# Patient Record
Sex: Male | Born: 1960
Health system: Southern US, Community
[De-identification: ages and names within clinical notes are randomized; demographics above are authoritative.]

## PROBLEM LIST (undated history)

## (undated) DIAGNOSIS — I219 Acute myocardial infarction, unspecified: Secondary | ICD-10-CM

## (undated) DIAGNOSIS — E785 Hyperlipidemia, unspecified: Secondary | ICD-10-CM

## (undated) DIAGNOSIS — E790 Hyperuricemia without signs of inflammatory arthritis and tophaceous disease: Secondary | ICD-10-CM

## (undated) DIAGNOSIS — I519 Heart disease, unspecified: Secondary | ICD-10-CM

## (undated) DIAGNOSIS — I10 Essential (primary) hypertension: Secondary | ICD-10-CM

## (undated) HISTORY — DX: Heart disease, unspecified: I51.9

## (undated) HISTORY — DX: Essential (primary) hypertension: I10

## (undated) HISTORY — DX: Acute myocardial infarction, unspecified: I21.9

## (undated) HISTORY — DX: Hyperlipidemia, unspecified: E78.5

---
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
## 2008-01-05 HISTORY — PX: ELBOW SURGERY: SHX618

## 2010-05-05 HISTORY — PX: CORONARY STENT PLACEMENT: SHX1402

## 2010-07-14 DIAGNOSIS — Z720 Tobacco use: Secondary | ICD-10-CM | POA: Insufficient documentation

## 2011-04-30 LAB — BASIC METABOLIC PANEL
BUN: 19 mg/dL (ref 4–21)
Creatinine: 1 mg/dL (ref 0.6–1.3)
Glucose: 111 mg/dL
POTASSIUM: 4.6 mmol/L (ref 3.4–5.3)
SODIUM: 142 mmol/L (ref 137–147)

## 2011-04-30 LAB — LIPID PANEL
Cholesterol: 166 mg/dL (ref 0–200)
HDL: 44 mg/dL (ref 35–70)
LDL Cholesterol: 82 mg/dL
Triglycerides: 201 mg/dL — AB (ref 40–160)

## 2011-04-30 LAB — HEPATIC FUNCTION PANEL
ALT: 40 U/L (ref 10–40)
AST: 40 U/L (ref 14–40)

## 2013-04-13 ENCOUNTER — Ambulatory Visit (INDEPENDENT_AMBULATORY_CARE_PROVIDER_SITE_OTHER): Payer: Managed Care, Other (non HMO) | Admitting: Family Medicine

## 2013-04-13 ENCOUNTER — Encounter: Payer: Self-pay | Admitting: Family Medicine

## 2013-04-13 VITALS — BP 129/87 | HR 72 | Ht 69.0 in | Wt 209.0 lb

## 2013-04-13 DIAGNOSIS — M76899 Other specified enthesopathies of unspecified lower limb, excluding foot: Secondary | ICD-10-CM

## 2013-04-13 DIAGNOSIS — I252 Old myocardial infarction: Secondary | ICD-10-CM

## 2013-04-13 DIAGNOSIS — M25859 Other specified joint disorders, unspecified hip: Secondary | ICD-10-CM | POA: Insufficient documentation

## 2013-04-13 DIAGNOSIS — E785 Hyperlipidemia, unspecified: Secondary | ICD-10-CM | POA: Insufficient documentation

## 2013-04-13 DIAGNOSIS — I2510 Atherosclerotic heart disease of native coronary artery without angina pectoris: Secondary | ICD-10-CM | POA: Insufficient documentation

## 2013-04-13 DIAGNOSIS — I10 Essential (primary) hypertension: Secondary | ICD-10-CM | POA: Insufficient documentation

## 2013-04-13 MED ORDER — METOPROLOL SUCCINATE ER 50 MG PO TB24: 50.0000 mg | ORAL_TABLET | Freq: Every day | ORAL | Status: DC

## 2013-04-13 MED ORDER — LISINOPRIL-HYDROCHLOROTHIAZIDE 20-12.5 MG PO TABS: 1.0000 | ORAL_TABLET | Freq: Every day | ORAL | Status: DC

## 2013-04-13 MED ORDER — TEMAZEPAM 30 MG PO CAPS: 30.0000 mg | ORAL_CAPSULE | Freq: Every evening | ORAL | Status: DC | PRN

## 2013-04-13 MED ORDER — PRASUGREL HCL 10 MG PO TABS: 10.0000 mg | ORAL_TABLET | Freq: Every day | ORAL | Status: DC

## 2013-04-13 MED ORDER — PITAVASTATIN CALCIUM 4 MG PO TABS: 1.0000 | ORAL_TABLET | Freq: Every day | ORAL | Status: DC

## 2013-04-13 NOTE — Progress Notes (Signed)
CC: Vincent NatterKevin Podoll is a 53 y.o. male is here for Establish Care and need meds refilled and lab work   Subjective: HPI:  Pleasant 53 year old here to establish care  Reports a history of essential hypertension currently taking lisinopril-hydrochlorothiazide and metoprolol. No outside blood pressures to report. He believes his blood pressure has been well controlled for years on this combination. Denies motor or sensory disturbances, chest pain, shortness of breath, orthopnea, peripheral edema, cough, nor cramping.  History of coronary artery disease with a non-ST elevation MI in 2012. He's not seen his cardiologist in over a year. He is taking a baby aspirin and effient on a daily basis without bruising or bleeding issues. Denies exertional or rest chest pain. Denies limb claudication with activity or elevation.  History of hyperlipidemia with intolerance to Crestor and Lipitor which caused severe myalgias. Has been taking Livalo up until one month ago, he had to stop because his insurance company would no longer cover this. He believes his cholesterol was well controlled while taking Livalo.  He reports a history of chronic hip pain bilaterally which is present only at the end of the day please been active during the day. Is described as a soreness anterior and in the groin. Been present for years has not been changing in severity or character. X-ray report from 2011 suggest femoral acetabular impingement. He takes an occasional hydrocodone for pain control, he understandably tries to avoid nonsteroidal anti-inflammatories with his heart disease   Review of Systems - General ROS: negative for - chills, fever, night sweats, weight gain or weight loss Ophthalmic ROS: negative for - decreased vision Psychological ROS: negative for - anxiety or depression ENT ROS: negative for - hearing change, nasal congestion, tinnitus or allergies Hematological and Lymphatic ROS: negative for - bleeding problems,  bruising or swollen lymph nodes Breast ROS: negative Respiratory ROS: no cough, shortness of breath, or wheezing Cardiovascular ROS: no chest pain or dyspnea on exertion Gastrointestinal ROS: no abdominal pain, change in bowel habits, or black or bloody stools Genito-Urinary ROS: negative for - genital discharge, genital ulcers, incontinence or abnormal bleeding from genitals Musculoskeletal ROS: negative for - joint pain or muscle pain other than that described above Neurological ROS: negative for - headaches or memory loss Dermatological ROS: negative for lumps, mole changes, rash and skin lesion changes  Past Medical History  Diagnosis Date  . Heart disease   . Heart attack   . Hypertension   . Hyperlipidemia     Past Surgical History  Procedure Laterality Date  . Coronary stent placement  05/2010  . Elbow surgery  2010   Family History  Problem Relation Age of Onset  . Breast cancer    . Heart attack    . Diabetes    . Hyperlipidemia    . Hypertension      History   Social History  . Marital Status: Unknown    Spouse Name: N/A    Number of Children: N/A  . Years of Education: N/A   Occupational History  . Not on file.   Social History Main Topics  . Smoking status: Current Every Day Smoker -- 1.00 packs/day for 20 years    Types: Cigarettes  . Smokeless tobacco: Not on file  . Alcohol Use: 0.5 oz/week    1 drink(s) per week  . Drug Use: No  . Sexual Activity: Yes    Partners: Female   Other Topics Concern  . Not on file   Social History  Narrative  . No narrative on file     Objective: BP 129/87  Pulse 72  Ht 5\' 9"  (1.753 m)  Wt 209 lb (94.802 kg)  BMI 30.85 kg/m2  General: Alert and Oriented, No Acute Distress HEENT: Pupils equal, round, reactive to light. Conjunctivae clear.   or status membranes pharynx unremarkable  Lungs: Clear to auscultation bilaterally, no wheezing/ronchi/rales.  Comfortable work of breathing. Good air movement. Cardiac:  Regular rate and rhythm. Normal S1/S2.  No murmurs, rubs, nor gallops.   Abdomen:  obese soft nontender Extremities: No peripheral edema.  Strong peripheral pulses.  Mental Status: No depression, anxiety, nor agitation. Skin: Warm and dry.  Assessment & Plan: Darrelle was seen today for establish care and need meds refilled and lab work.  Diagnoses and associated orders for this visit:  Essential hypertension, benign - BASIC METABOLIC PANEL WITH GFR  CAD (coronary artery disease)  History of MI (myocardial infarction)  Hyperlipidemia - Lipid panel  Femoral acetabular impingement  Other Orders - lisinopril-hydrochlorothiazide (PRINZIDE) 20-12.5 MG per tablet; Take 1 tablet by mouth daily. - prasugrel (EFFIENT) 10 MG TABS tablet; Take 1 tablet (10 mg total) by mouth daily. - Pitavastatin Calcium (LIVALO) 4 MG TABS; Take 1 tablet (4 mg total) by mouth daily. - temazepam (RESTORIL) 30 MG capsule; Take 1 capsule (30 mg total) by mouth at bedtime as needed for sleep. - metoprolol succinate (TOPROL-XL) 50 MG 24 hr tablet; Take 1 tablet (50 mg total) by mouth daily. Take with or immediately following a meal.    essential hypertension: Controlled continue lisinopril hydrochlorothiazide metoprolol   coronary artery disease with history of MI: Stable, Continue aspirin and effient, he is due for annual followup with his cardiologist I encouraged him to followup. Encouraged to stop smoking. Hyperlipidemia: Uncontrolled since he has a history of MI and not currently on statin, I encouraged him to restart livalo, he was provided with 2 weeks worth of samples and a new prescription with a saving card.  Anticipate a prior authorization for this. FAI: Stable and controlled with occasional hydrocodone  He was given labs for fasting lipid and metabolic panel to be obtained one to 2 months after starting livalo.  Return in about 3 months (around 07/13/2013).

## 2013-05-29 ENCOUNTER — Encounter: Payer: Self-pay | Admitting: Family Medicine

## 2013-05-29 DIAGNOSIS — Z86010 Personal history of colonic polyps: Secondary | ICD-10-CM | POA: Insufficient documentation

## 2013-05-29 DIAGNOSIS — Z87442 Personal history of urinary calculi: Secondary | ICD-10-CM | POA: Insufficient documentation

## 2013-05-30 ENCOUNTER — Encounter: Payer: Self-pay | Admitting: *Deleted

## 2013-07-13 ENCOUNTER — Encounter: Payer: Self-pay | Admitting: Family Medicine

## 2013-07-13 ENCOUNTER — Ambulatory Visit (INDEPENDENT_AMBULATORY_CARE_PROVIDER_SITE_OTHER): Payer: Managed Care, Other (non HMO) | Admitting: Family Medicine

## 2013-07-13 VITALS — BP 119/73 | HR 74 | Ht 68.0 in | Wt 210.0 lb

## 2013-07-13 DIAGNOSIS — M76899 Other specified enthesopathies of unspecified lower limb, excluding foot: Secondary | ICD-10-CM

## 2013-07-13 DIAGNOSIS — E785 Hyperlipidemia, unspecified: Secondary | ICD-10-CM

## 2013-07-13 DIAGNOSIS — M7611 Psoas tendinitis, right hip: Secondary | ICD-10-CM

## 2013-07-13 DIAGNOSIS — I10 Essential (primary) hypertension: Secondary | ICD-10-CM

## 2013-07-13 LAB — LIPID PANEL
CHOL/HDL RATIO: 4.5 ratio
Cholesterol: 181 mg/dL (ref 0–200)
HDL: 40 mg/dL (ref >39)
LDL Cholesterol: 105 mg/dL — ABNORMAL HIGH (ref 0–99)
TRIGLYCERIDES: 180 mg/dL — AB (ref <150)
VLDL: 36 mg/dL (ref 0–40)

## 2013-07-13 LAB — BASIC METABOLIC PANEL WITH GFR
BUN: 19 mg/dL (ref 6–23)
CALCIUM: 9.2 mg/dL (ref 8.4–10.5)
CO2: 27 mEq/L (ref 19–32)
Chloride: 102 mEq/L (ref 96–112)
Creat: 0.99 mg/dL (ref 0.50–1.35)
GFR, EST NON AFRICAN AMERICAN: 85 mL/min
Glucose, Bld: 108 mg/dL — ABNORMAL HIGH (ref 70–99)
POTASSIUM: 4.6 meq/L (ref 3.5–5.3)
Sodium: 138 mEq/L (ref 135–145)

## 2013-07-13 MED ORDER — HYDROCODONE-ACETAMINOPHEN 10-325 MG PO TABS: 0.5000 | ORAL_TABLET | Freq: Three times a day (TID) | ORAL | Status: DC | PRN

## 2013-07-13 NOTE — Progress Notes (Signed)
CC: Vincent NatterKevin Wall is a 53 y.o. male is here for Follow-up   Subjective: HPI:  Followup essential hypertension: Continues on lisinopril-hydrochlorothiazide and metoprolol on a daily basis without missed doses or known intolerances. No outside blood pressures to report. 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    Hyperlipidemia: Continues to take livalo on a daily basis now for 3 months without any known intolerances. Denies myalgias or right upper quadrant pain. Denies exertional chest pain or limb claudication.   His major complaint and concern for pain with a remote diagnosis of femoral acetabular impingement. Pain is worse after long periods of activity such as walking climbing stairs or any elevations. Symptoms are 100% alleviated with taking 2 Aleve every morning and 2 Aleve in the evening however he's worried that taking this on a daily basis for longer time person at risk for vascular events given recent research that he's done black box warnings. He has R. he had one heart attack in his life and he is on chronic antiplatelet therapy. I'm also learned today that he has a history of a bleeding gastric ulcer many years ago.  Review Of Systems Outlined In HPI  Past Medical History  Diagnosis Date  . Heart disease   . Heart attack   . Hypertension   . Hyperlipidemia     Past Surgical History  Procedure Laterality Date  . Coronary stent placement  05/2010  . Elbow surgery  2010   Family History  Problem Relation Age of Onset  . Breast cancer    . Heart attack    . Diabetes    . Hyperlipidemia    . Hypertension      History   Social History  . Marital Status: Unknown    Spouse Name: N/A    Number of Children: N/A  . Years of Education: N/A   Occupational History  . Not on file.   Social History Main Topics  . Smoking status: Current Every Day Smoker -- 1.00 packs/day for 20 years    Types: Cigarettes  . Smokeless tobacco: Not on  file  . Alcohol Use: 0.5 oz/week    1 drink(s) per week  . Drug Use: No  . Sexual Activity: Yes    Partners: Female   Other Topics Concern  . Not on file   Social History Narrative  . No narrative on file     Objective: BP 119/73  Pulse 74  Ht 5\' 8"  (1.727 m)  Wt 210 lb (95.255 kg)  BMI 31.94 kg/m2  General: Alert and Oriented, No Acute Distress HEENT: Pupils equal, round, reactive to light. Conjunctivae clear.  Moist mucous membranes pharynx unremarkable Lungs: Clear to auscultation bilaterally, no wheezing/ronchi/rales.  Comfortable work of breathing. Good air movement. Cardiac: Regular rate and rhythm. Normal S1/S2.  No murmurs, rubs, nor gallops.   Abdomen: Obese soft nontender Extremities: No peripheral edema.  Strong peripheral pulses.  Mental Status: No depression, anxiety, nor agitation. Skin: Warm and dry.  Assessment & Plan: [name] was seen today for follow-up.  Diagnoses and associated orders for this visit:  Essential hypertension, benign - BASIC METABOLIC PANEL WITH GFR  Hyperlipidemia - Lipid panel  Femoral acetabular impingement, right - HYDROcodone-acetaminophen (NORCO) 10-325 MG per tablet; Take 0.5-1 tablets by mouth every 8 (eight) hours as needed for moderate pain.    Essential hypertension: Controlled continue lisinopril hydrochlorothiazide and metoprolol. Needs renal function checked today Hyperlipidemia: Continue Livalo pending  lipid panel today Femoral acetabular impingement: Joint decision today to avoid nonsteroidal anti-inflammatories due to cardiac risk and risk of GI bleed. He did fine with hydrocodone in the past therefore will provide with refills today.  Return in about 3 months (around 10/13/2013).

## 2013-08-22 ENCOUNTER — Other Ambulatory Visit: Payer: Self-pay | Admitting: Family Medicine

## 2013-09-12 ENCOUNTER — Other Ambulatory Visit: Payer: Self-pay | Admitting: Family Medicine

## 2013-10-10 ENCOUNTER — Ambulatory Visit: Payer: Managed Care, Other (non HMO) | Admitting: Family Medicine

## 2013-10-15 ENCOUNTER — Encounter: Payer: Self-pay | Admitting: Family Medicine

## 2013-10-15 ENCOUNTER — Ambulatory Visit (INDEPENDENT_AMBULATORY_CARE_PROVIDER_SITE_OTHER): Payer: Managed Care, Other (non HMO) | Admitting: Family Medicine

## 2013-10-15 VITALS — BP 139/89 | HR 76 | Ht 69.0 in | Wt 215.0 lb

## 2013-10-15 DIAGNOSIS — M7611 Psoas tendinitis, right hip: Secondary | ICD-10-CM

## 2013-10-15 DIAGNOSIS — B9689 Other specified bacterial agents as the cause of diseases classified elsewhere: Secondary | ICD-10-CM

## 2013-10-15 DIAGNOSIS — A499 Bacterial infection, unspecified: Secondary | ICD-10-CM

## 2013-10-15 DIAGNOSIS — R739 Hyperglycemia, unspecified: Secondary | ICD-10-CM

## 2013-10-15 DIAGNOSIS — J329 Chronic sinusitis, unspecified: Secondary | ICD-10-CM

## 2013-10-15 DIAGNOSIS — Z86010 Personal history of colonic polyps: Secondary | ICD-10-CM

## 2013-10-15 DIAGNOSIS — I10 Essential (primary) hypertension: Secondary | ICD-10-CM

## 2013-10-15 LAB — HEMOGLOBIN A1C
Hgb A1c MFr Bld: 5.9 % — ABNORMAL HIGH (ref <5.7)
Mean Plasma Glucose: 123 mg/dL — ABNORMAL HIGH (ref <117)

## 2013-10-15 MED ORDER — LISINOPRIL-HYDROCHLOROTHIAZIDE 20-12.5 MG PO TABS: ORAL_TABLET | ORAL | Status: DC

## 2013-10-15 MED ORDER — HYDROCODONE-ACETAMINOPHEN 10-325 MG PO TABS: 0.5000 | ORAL_TABLET | Freq: Three times a day (TID) | ORAL | Status: DC | PRN

## 2013-10-15 MED ORDER — DOXYCYCLINE HYCLATE 100 MG PO TABS: ORAL_TABLET | ORAL | Status: AC

## 2013-10-15 MED ORDER — METOPROLOL SUCCINATE ER 50 MG PO TB24: ORAL_TABLET | ORAL | Status: DC

## 2013-10-15 NOTE — Progress Notes (Signed)
CC: Vincent Wall is a 53 y.o. male is here for Follow-up   Subjective: HPI:  Complains of right ear fullness with decreased hearing that has been present for the past month fluctuating from mild to moderate in severity. Accompanied by nasal congestion, sore throat and postnasal drip. Symptoms are slightly improved with Mucinex nothing else seems to make better or worse. They've been persistent for about a month now.  Followup hypertension: Continues to take lisinopril-hydrochlorothiazide and metoprolol basis no outside blood pressures to report denies chest pain shortness of breath orthopnea nor peripheral edema.  Followup FAI: Since I saw him last in July he has only required 60 doses of hydrocodone he usually takes his less thanthe week and only if he's been on his feet for long time and has right hip pain. He denies any change in frequency character or severity of the pain still localized in the groin and nonradiating. Worse with flexion.  Follow hyperglycemia: No outside blood sugars to report denies polyuria polyphasia or polydipsia    Review Of Systems Outlined In HPI  Past Medical History  Diagnosis Date  . Heart disease   . Heart attack   . Hypertension   . Hyperlipidemia     Past Surgical History  Procedure Laterality Date  . Coronary stent placement  05/2010  . Elbow surgery  2010   Family History  Problem Relation Age of Onset  . Breast cancer    . Heart attack    . Diabetes    . Hyperlipidemia    . Hypertension      History   Social History  . Marital Status: Unknown    Spouse Name: N/A    Number of Children: N/A  . Years of Education: N/A   Occupational History  . Not on file.   Social History Main Topics  . Smoking status: Current Every Day Smoker -- 1.00 packs/day for 20 years    Types: Cigarettes  . Smokeless tobacco: Not on file  . Alcohol Use: 0.5 oz/week    1 drink(s) per week  . Drug Use: No  . Sexual Activity: Yes    Partners: Female    Other Topics Concern  . Not on file   Social History Narrative  . No narrative on file     Objective: BP 139/89  Pulse 76  Ht 5\' 9"  (1.753 m)  Wt 215 lb (97.523 kg)  BMI 31.74 kg/m2  General: Alert and Oriented, No Acute Distress HEENT: Pupils equal, round, reactive to light. Conjunctivae clear.  External ears unremarkable, canals clear with intact TMs with appropriate landmarks. Left Middle ear appears open without effusion right middle ear with moderate serous effusion. Pink inferior turbinates.  Moist mucous membranes, pharynx without inflammation nor lesions.  Neck supple without palpable lymphadenopathy nor abnormal masses. Lungs: Clear to auscultation bilaterally, no wheezing/ronchi/rales.  Comfortable work of breathing. Good air movement. Cardiac: Regular rate and rhythm. Normal S1/S2.  No murmurs, rubs, nor gallops.   Extremities: No peripheral edema.  Strong peripheral pulses.  Mental Status: No depression, anxiety, nor agitation. Skin: Warm and dry.  Assessment & Plan: Vincent Wall was seen today for follow-up.  Diagnoses and associated orders for this visit:  Essential hypertension, benign - doxycycline (VIBRA-TABS) 100 MG tablet; One by mouth twice a day for ten days.  Hyperglycemia - Hemoglobin A1c  History of colonic polyps  Bacterial sinusitis  Femoral acetabular impingement, right - HYDROcodone-acetaminophen (NORCO) 10-325 MG per tablet; Take 0.5-1 tablets by mouth every 8 (  eight) hours as needed for moderate pain.  Other Orders - metoprolol succinate (TOPROL-XL) 50 MG 24 hr tablet; TAKE 1 TABLET (50 MG TOTAL) BY MOUTH DAILY. TAKE WITH OR IMMEDIATELY FOLLOWING A MEAL. - lisinopril-hydrochlorothiazide (PRINZIDE,ZESTORETIC) 20-12.5 MG per tablet; TAKE 1 TABLET BY MOUTH DAILY.    Essential hypertension: Controlled continue metoprolol, lisinopril, hydrochlorothiazide Hyperglycemia: Check an A1c today Bacterial sinusitis: Start doxycycline avoid  pseudoephedrine FAI: Controlled continue as needed hydrocodone  colonic polyps: Advised him to have a repeat colonoscopy, he has already received a letter from his former gastroenterologist had recommended that he call them this week to schedule an appointment in near future.  Return in about 3 months (around 01/15/2014) for BP Follow Up.

## 2013-10-16 ENCOUNTER — Encounter: Payer: Self-pay | Admitting: Family Medicine

## 2013-10-16 DIAGNOSIS — E1169 Type 2 diabetes mellitus with other specified complication: Secondary | ICD-10-CM | POA: Insufficient documentation

## 2013-10-16 DIAGNOSIS — R7303 Prediabetes: Secondary | ICD-10-CM

## 2013-10-16 DIAGNOSIS — E1140 Type 2 diabetes mellitus with diabetic neuropathy, unspecified: Secondary | ICD-10-CM | POA: Insufficient documentation

## 2014-01-15 ENCOUNTER — Encounter: Payer: Self-pay | Admitting: Family Medicine

## 2014-01-15 ENCOUNTER — Ambulatory Visit (INDEPENDENT_AMBULATORY_CARE_PROVIDER_SITE_OTHER): Payer: Managed Care, Other (non HMO) | Admitting: Family Medicine

## 2014-01-15 VITALS — BP 125/66 | HR 77 | Ht 68.0 in | Wt 214.0 lb

## 2014-01-15 DIAGNOSIS — R7303 Prediabetes: Secondary | ICD-10-CM

## 2014-01-15 DIAGNOSIS — E785 Hyperlipidemia, unspecified: Secondary | ICD-10-CM

## 2014-01-15 DIAGNOSIS — R7309 Other abnormal glucose: Secondary | ICD-10-CM

## 2014-01-15 DIAGNOSIS — I10 Essential (primary) hypertension: Secondary | ICD-10-CM

## 2014-01-15 DIAGNOSIS — L820 Inflamed seborrheic keratosis: Secondary | ICD-10-CM

## 2014-01-15 DIAGNOSIS — M7611 Psoas tendinitis, right hip: Secondary | ICD-10-CM

## 2014-01-15 LAB — POCT GLYCOSYLATED HEMOGLOBIN (HGB A1C): Hemoglobin A1C: 5.7

## 2014-01-15 MED ORDER — HYDROCODONE-ACETAMINOPHEN 10-325 MG PO TABS: 0.5000 | ORAL_TABLET | Freq: Three times a day (TID) | ORAL | Status: DC | PRN

## 2014-01-15 MED ORDER — VARENICLINE TARTRATE 0.5 MG X 11 & 1 MG X 42 PO MISC: ORAL | Status: DC

## 2014-01-15 MED ORDER — PRASUGREL HCL 10 MG PO TABS: 10.0000 mg | ORAL_TABLET | Freq: Every day | ORAL | Status: DC

## 2014-01-15 MED ORDER — METOPROLOL SUCCINATE ER 50 MG PO TB24: ORAL_TABLET | ORAL | Status: DC

## 2014-01-15 MED ORDER — LISINOPRIL-HYDROCHLOROTHIAZIDE 20-12.5 MG PO TABS: ORAL_TABLET | ORAL | Status: DC

## 2014-01-15 MED ORDER — PITAVASTATIN CALCIUM 4 MG PO TABS
1.0000 | ORAL_TABLET | Freq: Every day | ORAL | Status: DC
Start: 2014-01-15 — End: 2014-07-16

## 2014-01-15 NOTE — Progress Notes (Signed)
CC: Vincent Wall is a 54 y.o. male is here for Follow-up   Subjective: HPI:  Follow-up essential hypertension: Continues on metoprolol, lisinopril-hydrochlorothiazide with no outside blood pressures to report. Denies chest pain shortness of breath orthopnea nor peripheral edema  Follow-up FAI of the right hip. Tells me that symptoms have not changed in severity character or frequency. He still gets a sharp pain in the right groin that is only present at the end of the long day if he's been on his feet. 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 He occasionally takes hydrocodone if pain gets to moderate in severity at the end of the day and he is having difficulty sleeping as the pain, a 60 tablet prescription has lasted him 6 months now. Denies any new motor or sensory disturbances in the pelvis or right lower extremity  Follow prediabetes: No outside blood sugars to report. Denies polyuria polyphagia polydipsia  Follow-up hyperlipidemia: He tells me that his insurance company will no longer cover Livalo.  He has an intolerance to multiple other statins. He has a history of heart attack and coronary artery disease and needs to be on a statin if at all possible.  Complains of growths on the back and right forearm that has been present for year which seem to be enlarging and are painful on a daily basis. Described as mild in severity, worse with touch, worse with friction nothing seems to make it better or worse other than above  He once he can have a prescription for Chantix because he wants to try to quit smoking again. He's had success with this in the past and although it causes vivid dreams they're never frightening.  Review Of Systems Outlined In HPI  Past Medical History  Diagnosis Date  . Heart disease   . Heart attack   . Hypertension   . Hyperlipidemia     Past Surgical History  Procedure  Laterality Date  . Coronary stent placement  05/2010  . Elbow surgery  2010   Family History  Problem Relation Age of Onset  . Breast cancer    . Heart attack    . Diabetes    . Hyperlipidemia    . Hypertension      History   Social History  . Marital Status: Unknown    Spouse Name: N/A    Number of Children: N/A  . Years of Education: N/A   Occupational History  . Not on file.   Social History Main Topics  . Smoking status: Current Every Day Smoker -- 1.00 packs/day for 20 years    Types: Cigarettes  . Smokeless tobacco: Not on file  . Alcohol Use: 0.5 oz/week    1 drink(s) per week  . Drug Use: No  . Sexual Activity:    Partners: Female   Other Topics Concern  . Not on file   Social History Narrative     Objective: BP 125/66 mmHg  Pulse 77  Ht  (1.727 m)  Wt 214 lb (97.07 kg)  BMI 32.55 kg/m2  General: Alert and Oriented, No Acute Distress HEENT: Pupils equal, round, reactive to light. Conjunctivae clear.  Moist mucous membranes pharynx unremarkable Lungs: Clear to auscultation bilaterally, no wheezing/ronchi/rales.  Comfortable work of breathing. Good air movement. Cardiac: Regular rate and rhythm. Normal S1/S2.  No murmurs, rubs, nor gallops.   Abdomen: Obese and soft Extremities: No peripheral  edema.  Strong peripheral pulses.  Mental Status: No depression, anxiety, nor agitation. Skin: Warm and dry. 1 cm diameter inflamed seborrheic keratosis on the right volar forearm and left posterior flank   Assessment & Plan: Vincent Wall was seen today for follow-up.  Diagnoses and associated orders for this visit:  Essential hypertension, benign - lisinopril-hydrochlorothiazide (PRINZIDE,ZESTORETIC) 20-12.5 MG per tablet; TAKE 1 TABLET BY MOUTH DAILY. - metoprolol succinate (TOPROL-XL) 50 MG 24 hr tablet; TAKE 1 TABLET (50 MG TOTAL) BY MOUTH DAILY. TAKE WITH OR IMMEDIATELY FOLLOWING A MEAL.  Femoral acetabular impingement, right - HYDROcodone-acetaminophen  (NORCO) 10-325 MG per tablet; Take 0.5-1 tablets by mouth every 8 (eight) hours as needed for moderate pain.  Prediabetes  Hyperlipidemia - Pitavastatin Calcium (LIVALO) 4 MG TABS; Take 1 tablet (4 mg total) by mouth daily.  Seborrheic keratoses, inflamed  Other Orders - prasugrel (EFFIENT) 10 MG TABS tablet; Take 1 tablet (10 mg total) by mouth daily. - varenicline (CHANTIX STARTING MONTH PAK) 0.5 MG X 11 & 1 MG X 42 tablet; Take one 0.5 mg tablet by mouth once daily for 3 days, then increase to one 0.5 mg tablet twice daily for 4 days, then increase to one 1 mg tablet twice daily.    Essential hypertension: Controlled continue metoprolol and was in April-hydrochlorothiazide   FAI: Controlled, continued sparing as needed use of hydrocodone, physical therapy referral was offered however he politely declines at this time Prediabetes: A1c of 5.7, controlled Hyperlipidemia: New perception for Livalo with savings voucher, I think the main reason this is not covered is because the company providing his prescription coverage has changed Seborrheic keratoses: Cryotherapy described below Provided with Chantix to help with smoking cessation   Return in about 6 months (around 07/16/2014) for BP. Cryotherapy Procedure Note  Pre-operative Diagnosis: inflamed seborrheic keratosis 2  Post-operative Diagnosis: same  Locations: right volar forearm and left posterior flank  Indications: persistent pain  Anesthesia: none  Procedure Details  History of allergy to iodine: no. Pacemaker? no.  Patient informed of risks (permanent scarring, infection, light or dark discoloration, bleeding, infection, weakness, numbness and recurrence of the lesion) and benefits of the procedure and verbal informed consent obtained.  The areas are treated with liquid nitrogen therapy, frozen until ice ball extended 2 mm beyond lesion, allowed to thaw, and treated again. The patient tolerated procedure well.  The  patient was instructed on post-op care, warned that there may be blister formation, redness and pain. Recommend OTC analgesia as needed for pain.  Condition: Stable  Complications: none.  Plan: 1. Instructed to keep the area dry and covered for 24-48h and clean thereafter. 2. Warning signs of infection were reviewed.   3. Recommended that the patient use OTC analgesics as needed for pain.  4. Return PRN

## 2014-01-15 NOTE — Addendum Note (Signed)
Addended by: Avon GullyMCCRIMMON, ANDREA C on: 01/15/2014 05:00 PM   Modules accepted: Orders

## 2014-01-16 ENCOUNTER — Other Ambulatory Visit: Payer: Self-pay | Admitting: Family Medicine

## 2014-07-16 ENCOUNTER — Ambulatory Visit (INDEPENDENT_AMBULATORY_CARE_PROVIDER_SITE_OTHER): Payer: Managed Care, Other (non HMO) | Admitting: Family Medicine

## 2014-07-16 ENCOUNTER — Encounter: Payer: Self-pay | Admitting: Family Medicine

## 2014-07-16 VITALS — BP 126/72 | HR 77 | Ht 68.0 in | Wt 215.0 lb

## 2014-07-16 DIAGNOSIS — G4700 Insomnia, unspecified: Secondary | ICD-10-CM

## 2014-07-16 DIAGNOSIS — I10 Essential (primary) hypertension: Secondary | ICD-10-CM

## 2014-07-16 DIAGNOSIS — I2510 Atherosclerotic heart disease of native coronary artery without angina pectoris: Secondary | ICD-10-CM

## 2014-07-16 DIAGNOSIS — M7611 Psoas tendinitis, right hip: Secondary | ICD-10-CM

## 2014-07-16 DIAGNOSIS — E785 Hyperlipidemia, unspecified: Secondary | ICD-10-CM | POA: Diagnosis not present

## 2014-07-16 MED ORDER — LISINOPRIL-HYDROCHLOROTHIAZIDE 20-12.5 MG PO TABS: 1.0000 | ORAL_TABLET | Freq: Every day | ORAL | Status: DC

## 2014-07-16 MED ORDER — HYDROCODONE-ACETAMINOPHEN 10-325 MG PO TABS: 0.5000 | ORAL_TABLET | Freq: Three times a day (TID) | ORAL | Status: DC | PRN

## 2014-07-16 MED ORDER — VARENICLINE TARTRATE 0.5 MG X 11 & 1 MG X 42 PO MISC: ORAL | Status: DC

## 2014-07-16 MED ORDER — METOPROLOL SUCCINATE ER 50 MG PO TB24: ORAL_TABLET | ORAL | Status: DC

## 2014-07-16 MED ORDER — TEMAZEPAM 30 MG PO CAPS: 30.0000 mg | ORAL_CAPSULE | Freq: Every evening | ORAL | Status: DC | PRN

## 2014-07-16 MED ORDER — PRASUGREL HCL 10 MG PO TABS: 10.0000 mg | ORAL_TABLET | Freq: Every day | ORAL | Status: DC

## 2014-07-16 MED ORDER — VARENICLINE TARTRATE 1 MG PO TABS: 1.0000 mg | ORAL_TABLET | Freq: Two times a day (BID) | ORAL | Status: DC

## 2014-07-16 NOTE — Progress Notes (Signed)
CC: Vincent Wall is a 54 y.o. male is here for Hypertension   Subjective: HPI:  Follow-up essential hypertension: No outside blood pressures report. Continues on the lisinopril-HCTZ and metoprolol.    Follow-up hyperlipidemia: He statin intolerant and cannot afford livalo.  He tries to stay active at work.  Follow-up CAD: He is on a statin, aspirin, is statin intolerant and is interested in quitting smoking. He was able to use Chantix for 1 month and get down to 2 cigarettes a day however pharmacy refused to refill the starter pack and he was unaware that he needed to request a continuing pack. Is back to smoking August pack of cigarettes a day but wants to quit smoking. He denies chest pain orthopnea peripheral edema nor motor or sensory disturbances other than some insomnia.  He got married in February and has been living in a house that has a dog that sleeps with him. He's never had this situation before and it wakes him up multiple times during the night. He's had difficulty with falling asleep and staying asleep in the past and Restoril has been beneficial, he is requesting a refill. Symptoms are present on a nightly basis mild to moderate in severity. Nothingmakes symptoms better or worse  Requesting refill on hydrocodone, a 60 tablet for prescription and has last him 6 months. He denies any change in the pain in the anterior aspect of his hip pain is worse with any external rotation and flexion of the hip or when seated for long periods of time. He only takes this medication in the evenings once he is home with pain is persistent.    Review Of Systems Outlined In HPI  Past Medical History  Diagnosis Date  . Heart disease   . Heart attack   . Hypertension   . Hyperlipidemia     Past Surgical History  Procedure Laterality Date  . Coronary stent placement  05/2010  . Elbow surgery  2010   Family History  Problem Relation Age of Onset  . Breast cancer    . Heart attack    .  Diabetes    . Hyperlipidemia    . Hypertension      History   Social History  . Marital Status: Unknown    Spouse Name: N/A  . Number of Children: N/A  . Years of Education: N/A   Occupational History  . Not on file.   Social History Main Topics  . Smoking status: Current Every Day Smoker -- 1.00 packs/day for 20 years    Types: Cigarettes  . Smokeless tobacco: Not on file  . Alcohol Use: 0.5 oz/week    1 drink(s) per week  . Drug Use: No  . Sexual Activity:    Partners: Female   Other Topics Concern  . Not on file   Social History Narrative     Objective: BP 126/72 mmHg  Pulse 77  Ht 5\' 8"  (1.727 m)  Wt 215 lb (97.523 kg)  BMI 32.70 kg/m2  General: Alert and Oriented, No Acute Distress HEENT: Pupils equal, round, reactive to light. Conjunctivae clear. Moist mucous membranes  Lungs: Clear to auscultation bilaterally, no wheezing/ronchi/rales.  Comfortable work of breathing. Good air movement. Cardiac: Regular rate and rhythm. Normal S1/S2.  No murmurs, rubs, nor gallops.   Abdomen:Obese and soft  Extremities: No peripheral edema.  Strong peripheral pulses.  Mental Status: No depression, anxiety, nor agitation. Skin: Warm and dry.  Assessment & Plan: Vincent Wall was seen today for hypertension.  Diagnoses and all orders for this visit:  Essential hypertension, benign Orders: -     metoprolol succinate (TOPROL-XL) 50 MG 24 hr tablet; TAKE 1 TABLET (50 MG TOTAL) BY MOUTH DAILY. TAKE WITH OR IMMEDIATELY FOLLOWING A MEAL.  Hyperlipidemia  Coronary artery disease involving native coronary artery of native heart without angina pectoris  Insomnia  Femoral acetabular impingement, right Orders: -     HYDROcodone-acetaminophen (NORCO) 10-325 MG per tablet; Take 0.5-1 tablets by mouth every 8 (eight) hours as needed for moderate pain.  Other orders -     lisinopril-hydrochlorothiazide (PRINZIDE,ZESTORETIC) 20-12.5 MG per tablet; Take 1 tablet by mouth daily. -      varenicline (CHANTIX STARTING MONTH PAK) 0.5 MG X 11 & 1 MG X 42 tablet; Take one 0.5 mg tablet by mouth once daily for 3 days, then increase to one 0.5 mg tablet twice daily for 4 days, then increase to one 1 mg tablet twice daily. -     varenicline (CHANTIX CONTINUING MONTH PAK) 1 MG tablet; Take 1 tablet (1 mg total) by mouth 2 (two) times daily. -     temazepam (RESTORIL) 30 MG capsule; Take 1 capsule (30 mg total) by mouth at bedtime as needed for sleep. -     prasugrel (EFFIENT) 10 MG TABS tablet; Take 1 tablet (10 mg total) by mouth daily.   Essential hypertension: Controlled continue lisinopril, HCTZ, metoprolol. Hyperlipidemia: Encourage diet and exercise for weight loss Coronary artery disease: Controlled continue beta blocker, aspirin, Effient and refilled Chantix to help with smoking cessation Insomnia: Uncontrolled, restarting temazepam FAI: Controlled with sparing as needed use of hydrocodone  Return in about 6 months (around 01/16/2015) for BP.

## 2014-07-22 ENCOUNTER — Other Ambulatory Visit: Payer: Self-pay | Admitting: Family Medicine

## 2014-08-20 ENCOUNTER — Ambulatory Visit (INDEPENDENT_AMBULATORY_CARE_PROVIDER_SITE_OTHER): Payer: Managed Care, Other (non HMO) | Admitting: Family Medicine

## 2014-08-20 ENCOUNTER — Encounter: Payer: Self-pay | Admitting: Family Medicine

## 2014-08-20 VITALS — BP 123/80 | HR 67 | Wt 216.0 lb

## 2014-08-20 DIAGNOSIS — M722 Plantar fascial fibromatosis: Secondary | ICD-10-CM

## 2014-08-20 MED ORDER — DICLOFENAC SODIUM 50 MG PO TBEC: DELAYED_RELEASE_TABLET | ORAL | Status: DC

## 2014-08-20 NOTE — Progress Notes (Signed)
CC: Temple Sporer is a 54 y.o. male is here for right heel pain   Subjective: HPI:  Right heel pain present on a daily basis for the last 6 months, for the last month it's become moderate in severity and bothering his quality of life. Present mostly first thing in the morning especially when getting out of bed and then improves after shower. He will return later in the [facility] physician. Inactivity. It's nonradiating. Sharp. No benefit from hydrocodone. No other interventions other than heel cups which are not particularly helping. Denies any overlying skin changes or joint pain elsewhere. Denies any bruising, fevers, chills.   Review Of Systems Outlined In HPI  Past Medical History  Diagnosis Date  . Heart disease   . Heart attack   . Hypertension   . Hyperlipidemia     Past Surgical History  Procedure Laterality Date  . Coronary stent placement  05/2010  . Elbow surgery  2010   Family History  Problem Relation Age of Onset  . Breast cancer    . Heart attack    . Diabetes    . Hyperlipidemia    . Hypertension      Social History   Social History  . Marital Status: Unknown    Spouse Name: N/A  . Number of Children: N/A  . Years of Education: N/A   Occupational History  . Not on file.   Social History Main Topics  . Smoking status: Current Every Day Smoker -- 1.00 packs/day for 20 years    Types: Cigarettes  . Smokeless tobacco: Not on file  . Alcohol Use: 0.5 oz/week    1 drink(s) per week  . Drug Use: No  . Sexual Activity:    Partners: Female   Other Topics Concern  . Not on file   Social History Narrative     Objective: BP 123/80 mmHg  Pulse 67  Wt 216 lb (97.977 kg)  Vital signs reviewed. General: Alert and Oriented, No Acute Distress HEENT: Pupils equal, round, reactive to light. Conjunctivae clear.  External ears unremarkable.  Moist mucous membranes. Lungs: Clear and comfortable work of breathing, speaking in full sentences without accessory muscle  use. Cardiac: Regular rate and rhythm.  Neuro: CN II-XII grossly intact, gait normal. Extremities: No peripheral edema.  Strong peripheral pulses. With respect to right foot: Pain is localized to the inferior surface of the calcaneus, no posterior calcaneal pain. No pain with manipulation of the Achilles tendon. Minimal pain with passive dorsiflexion. No overlying skin changes. No pain the base of the fifth metatarsal Mental Status: No depression, anxiety, nor agitation. Logical though process. Skin: Warm and dry.  Assessment & Plan: Kaien was seen today for right heel pain.  Diagnoses and all orders for this visit:  Plantar fasciitis, right -     diclofenac (VOLTAREN) 50 MG EC tablet; Take one tablet every 8 hours only as needed for pain, take with small snack.   Fasciitis: Start home rehabilitation exercises that were provided to him today, also start as needed diclofenac. If no better in 2-3 weeks follow up with sports medicine for a second opinion.   Return if symptoms worsen or fail to improve.

## 2014-09-02 ENCOUNTER — Other Ambulatory Visit: Payer: Self-pay | Admitting: Family Medicine

## 2014-09-03 ENCOUNTER — Encounter: Payer: Self-pay | Admitting: Family Medicine

## 2014-09-03 ENCOUNTER — Ambulatory Visit (INDEPENDENT_AMBULATORY_CARE_PROVIDER_SITE_OTHER): Payer: Managed Care, Other (non HMO) | Admitting: Family Medicine

## 2014-09-03 VITALS — BP 120/85 | HR 79 | Wt 218.0 lb

## 2014-09-03 DIAGNOSIS — M722 Plantar fascial fibromatosis: Secondary | ICD-10-CM

## 2014-09-03 NOTE — Progress Notes (Signed)
   Subjective:    I'm seeing this patient as a consultation for:  Dr. Ivan Anchors  CC: Right foot plantar fasciitis  HPI: Patient has a six-month history of slowly worsening right foot heel pain. Pain is worse when he stands up morning and following a long day of activity. He has tried home exercises and ice heel cups and oral pain medicines that have not helped. He works as an Midwife which require some walking. The pain is now interfering with his job. He notes that he is going on vacation in a week and would like to be a little play golf without having a lot of pain and is interested in an injection today. He has never had a plantar fasciitis injection before. He notes golf is especially painful because of the walking in the golf shoes.  Past medical history, Surgical history, Family history not pertinant except as noted below, Social history, Allergies, and medications have been entered into the medical record, reviewed, and no changes needed.   Review of Systems: No headache, visual changes, nausea, vomiting, diarrhea, constipation, dizziness, abdominal pain, skin rash, fevers, chills, night sweats, weight loss, swollen lymph nodes, body aches, joint swelling, muscle aches, chest pain, shortness of breath, mood changes, visual or auditory hallucinations.   Objective:    Filed Vitals:   09/03/14 1548  BP: 120/85  Pulse: 79   General: Well Developed, well nourished, and in no acute distress.  Neuro/Psych: Alert and oriented x3, extra-ocular muscles intact, able to move all 4 extremities, sensation grossly intact. Skin: Warm and dry, no rashes noted.  Respiratory: Not using accessory muscles, speaking in full sentences, trachea midline.  Cardiovascular: Pulses palpable, no extremity edema. Abdomen: Does not appear distended. MSK: Right foot normal-appearing. Tender palpation right plantar calcaneus. Pulses and capillary refill  and sensation intact   Procedure: Real-time Ultrasound Guided  Injection of right plantar fasciitis  Device: GE Logiq E  Images permanently stored and available for review in the ultrasound unit. Verbal informed consent obtained. Discussed risks and benefits of procedure. Warned about infection bleeding damage to structures skin hypopigmentation and fat atrophy among others. Patient expresses understanding and agreement Time-out conducted.  Noted no overlying erythema, induration, or other signs of local infection.  Skin prepped in a sterile fashion.  Local anesthesia: Topical Ethyl chloride.  With sterile technique and under real time ultrasound guidance: 40 mg of Kenalog and 2 mL of Marcaine injected easily.  Completed without difficulty  Pain immediately resolved suggesting accurate placement of the medication.  Advised to call if fevers/chills, erythema, induration, drainage, or persistent bleeding.  Images permanently stored and available for review in the ultrasound unit.  Impression: Technically successful ultrasound guided injection.   No results found for this or any previous visit (from the past 24 hour(s)). No results found.  Impression and Recommendations:   This case required medical decision making of moderate complexity.

## 2014-09-03 NOTE — Patient Instructions (Signed)
Thank you for coming in today. Call or go to the ER if you develop a large red swollen joint with extreme pain or oozing puss.  Do the exercises we discussed including heel raises multiple times per day Tried the ice massage Return in about 2-4 weeks for recheck. We can make orthotics at that time   Plantar Fasciitis Plantar fasciitis is a common condition that causes foot pain. It is soreness (inflammation) of the band of tough fibrous tissue on the bottom of the foot that runs from the heel bone (calcaneus) to the ball of the foot. The cause of this soreness may be from excessive standing, poor fitting shoes, running on hard surfaces, being overweight, having an abnormal walk, or overuse (this is common in runners) of the painful foot or feet. It is also common in aerobic exercise dancers and ballet dancers. SYMPTOMS  Most people with plantar fasciitis complain of:  Severe pain in the morning on the bottom of their foot especially when taking the first steps out of bed. This pain recedes after a few minutes of walking.  Severe pain is experienced also during walking following a long period of inactivity.  Pain is worse when walking barefoot or up stairs DIAGNOSIS   Your caregiver will diagnose this condition by examining and feeling your foot.  Special tests such as X-rays of your foot, are usually not needed. PREVENTION   Consult a sports medicine professional before beginning a new exercise program.  Walking programs offer a good workout. With walking there is a lower chance of overuse injuries common to runners. There is less impact and less jarring of the joints.  Begin all new exercise programs slowly. If problems or pain develop, decrease the amount of time or distance until you are at a comfortable level.  Wear good shoes and replace them regularly.  Stretch your foot and the heel cords at the back of the ankle (Achilles tendon) both before and after exercise.  Run or  exercise on even surfaces that are not hard. For example, asphalt is better than pavement.  Do not run barefoot on hard surfaces.  If using a treadmill, vary the incline.  Do not continue to workout if you have foot or joint problems. Seek professional help if they do not improve. HOME CARE INSTRUCTIONS   Avoid activities that cause you pain until you recover.  Use ice or cold packs on the problem or painful areas after working out.  Only take over-the-counter or prescription medicines for pain, discomfort, or fever as directed by your caregiver.  Soft shoe inserts or athletic shoes with air or gel sole cushions may be helpful.  If problems continue or become more severe, consult a sports medicine caregiver or your own health care provider. Cortisone is a potent anti-inflammatory medication that may be injected into the painful area. You can discuss this treatment with your caregiver. MAKE SURE YOU:   Understand these instructions.  Will watch your condition.  Will get help right away if you are not doing well or get worse. Document Released: 09/15/2000 Document Revised: 03/15/2011 Document Reviewed: 11/15/2007 [facility] Patient Information 2015 [place], [place]. This information is not intended to replace advice given to you by your health care provider. Make sure you discuss any questions you have with your health care provider.

## 2014-09-03 NOTE — Assessment & Plan Note (Signed)
After discussion of risks and benefits including fat atrophy infection etc. patient agreed for plantar fasciitis injection. Additionally patient will work on heel eccentric exercises, ice massage, and padded heel cups. He will return in a few weeks for orthotics.

## 2014-09-16 ENCOUNTER — Encounter: Payer: Self-pay | Admitting: Family Medicine

## 2014-09-16 ENCOUNTER — Ambulatory Visit (INDEPENDENT_AMBULATORY_CARE_PROVIDER_SITE_OTHER): Payer: Managed Care, Other (non HMO) | Admitting: Family Medicine

## 2014-09-16 VITALS — BP 123/80 | HR 76 | Ht 68.0 in | Wt 214.0 lb

## 2014-09-16 DIAGNOSIS — M722 Plantar fascial fibromatosis: Secondary | ICD-10-CM | POA: Diagnosis not present

## 2014-09-16 NOTE — Progress Notes (Signed)

## 2014-09-16 NOTE — Patient Instructions (Signed)
Thank you for coming in today. Continue the exercises.  Come back as needed.  We can make a pair for your golf shoes if these dont fit.

## 2014-10-16 ENCOUNTER — Other Ambulatory Visit: Payer: Self-pay | Admitting: *Deleted

## 2014-10-16 DIAGNOSIS — M7611 Psoas tendinitis, right hip: Secondary | ICD-10-CM

## 2014-10-16 MED ORDER — HYDROCODONE-ACETAMINOPHEN 10-325 MG PO TABS: 0.5000 | ORAL_TABLET | Freq: Three times a day (TID) | ORAL | Status: DC | PRN

## 2014-11-01 ENCOUNTER — Encounter: Payer: Self-pay | Admitting: Physician Assistant

## 2014-11-01 ENCOUNTER — Ambulatory Visit (INDEPENDENT_AMBULATORY_CARE_PROVIDER_SITE_OTHER): Payer: Managed Care, Other (non HMO) | Admitting: Physician Assistant

## 2014-11-01 VITALS — BP 120/64 | HR 77 | Ht 68.0 in | Wt 218.0 lb

## 2014-11-01 DIAGNOSIS — J45901 Unspecified asthma with (acute) exacerbation: Secondary | ICD-10-CM | POA: Diagnosis not present

## 2014-11-01 DIAGNOSIS — R062 Wheezing: Secondary | ICD-10-CM

## 2014-11-01 MED ORDER — ALBUTEROL SULFATE HFA 108 (90 BASE) MCG/ACT IN AERS: 2.0000 | INHALATION_SPRAY | Freq: Four times a day (QID) | RESPIRATORY_TRACT | Status: DC | PRN

## 2014-11-01 MED ORDER — IPRATROPIUM-ALBUTEROL 0.5-2.5 (3) MG/3ML IN SOLN
3.0000 mL | Freq: Once | RESPIRATORY_TRACT | Status: AC
  Administered 2014-11-01: 3 mL via RESPIRATORY_TRACT

## 2014-11-01 MED ORDER — AZITHROMYCIN 250 MG PO TABS: ORAL_TABLET | ORAL | Status: DC

## 2014-11-01 MED ORDER — PREDNISONE 20 MG PO TABS: ORAL_TABLET | ORAL | Status: DC

## 2014-11-01 MED ORDER — HYDROCODONE-HOMATROPINE 5-1.5 MG/5ML PO SYRP: 5.0000 mL | ORAL_SOLUTION | Freq: Four times a day (QID) | ORAL | Status: DC | PRN

## 2014-11-01 NOTE — Progress Notes (Addendum)
Vincent Wall is a 54 y.o. male who presents to [name] today for cough. Mr. Vincent Wall states that his cough started a couple days ago and is mostly dry, but was a little productive with yellow-white phlegm yesterday. He states that he coughed so much and so hard yesterday that he vomited once. He also states that he is experiencing headaches, sore throat, runny nose, shortness of breath and body aches; he attributes all of these symptoms to his ongoing cough. He states that this type of illness occurs at least once a year for him. He has tried Mucinex which has not helped. He states that nothing in particular makes the cough worse and that it is pretty constant. He has not had an influenza vaccine, however he thinks he has received a pneumonia vaccine due to his past h/o pneumonia. He denies ill contacts, fever, chills, chest pain, hemoptysis, nausea, abdominal pain or changes in bowel habits.     Past Medical History  Diagnosis Date  . Heart disease   . Heart attack (HCC)   . Hypertension   . Hyperlipidemia    Past Surgical History  Procedure Laterality Date  . Coronary stent placement  05/2010  . Elbow surgery  2010   Social History  Substance Use Topics  . Smoking status: Current Every Day Smoker -- 1.00 packs/day for 20 years    Types: Cigarettes  . Smokeless tobacco: Not on file  . Alcohol Use: 0.5 oz/week    1 drink(s) per week   family history includes Breast cancer in an other family member; Diabetes in an other family member; Heart attack in an other family member; Hyperlipidemia in an other family member; Hypertension in an other family member.  ROS as above Medications: Current Outpatient Prescriptions  Medication Sig Dispense Refill  . ALPRAZolam (XANAX) 0.5 MG tablet Take 1 tablet (0.5 mg total) by mouth daily as needed for anxiety or sleep. 30 tablet 0  . aspirin 81 MG tablet Take 81 mg by mouth daily.    . diclofenac (VOLTAREN) 50 MG  EC tablet Take one tablet every 8 hours only as needed for pain, take with small snack. 45 tablet 0  . HYDROcodone-acetaminophen (NORCO) 10-325 MG tablet Take 0.5-1 tablets by mouth every 8 (eight) hours as needed for moderate pain. 60 tablet 0  . lisinopril-hydrochlorothiazide (PRINZIDE,ZESTORETIC) 20-12.5 MG per tablet Take 1 tablet by mouth daily. 90 tablet 2  . metoprolol succinate (TOPROL-XL) 50 MG 24 hr tablet TAKE 1 TABLET (50 MG TOTAL) BY MOUTH DAILY. TAKE WITH OR IMMEDIATELY FOLLOWING A MEAL. 90 tablet 2  . omeprazole (PRILOSEC) 20 MG capsule Take 20 mg by mouth daily.    . prasugrel (EFFIENT) 10 MG TABS tablet Take 1 tablet (10 mg total) by mouth daily. 90 tablet 3  . temazepam (RESTORIL) 30 MG capsule Take 1 capsule (30 mg total) by mouth at bedtime as needed for sleep. 30 capsule 2  . varenicline (CHANTIX CONTINUING MONTH PAK) 1 MG tablet Take 1 tablet (1 mg total) by mouth 2 (two) times daily. 60 tablet 5  . albuterol (PROVENTIL HFA;VENTOLIN HFA) 108 (90 BASE) MCG/ACT inhaler Inhale 2 puffs into the lungs every 6 (six) hours as needed for wheezing or shortness of breath. 1 Inhaler 3  . azithromycin (ZITHROMAX) 250 MG tablet Take 2 tablets now and then 1 tablet for 4 days. 6 tablet 0  . HYDROcodone-homatropine (HYCODAN) 5-1.5 MG/5ML syrup Take 5 mLs by mouth every 6 (six)  hours as needed for cough. 120 mL 0  . predniSONE (DELTASONE) 20 MG tablet Take 3 tablets for 3 days, take 2 tablets for 3 days, take 1 tablet for 3 days, take 1/2 tablet for 4 days. 21 tablet 0   No current facility-administered medications for this visit.    Allergies  Allergen Reactions  . Crestor [Rosuvastatin]     Myalgia    . Lipitor [Atorvastatin]     Myalgias  . Statins     Joint pain; pt states his body "locked up"     Exam:  BP 120/64 mmHg  Pulse 77  Ht 5\' 8"  (1.727 m)  Wt 218 lb (98.884 kg)  BMI 33.15 kg/m2  SpO2 99% Gen: WDWN male in no acute distress. He appears ill, but non-toxic.   HEENT: EOMI,  MMM. TMs visualized bilaterally with no apparent abnormalities. Posterior oropharynx is without erythema, exudate or tonsillar enlargement. Lungs: Deep inhalation produces forceful cough and patient is unable to continue with this exam maneuver bilaterally. Diffuse wheezing heard throughout lung fields posteriorly and anteriorly. Lung sounds moderately diminished throughout.  Heart: RRR no MRG Exts: Brisk capillary refill, warm and well perfused.   Patient received DuoNeb in office today and states that he feels a great deal better. Auscultation of lung fields after treatment reveal continued wheezing and mildly decreased breath sounds.   Assessment: Likely asthmatic bronchitis based on patient description of symptoms all stemming from cough, patient h/o similar illness and patient response to DuoNeb. Possible pneumonia based on patient previous h/o of pneumonia - will give antibiotics for prophylaxis due to patient h/o as well as diffuse wheezing throughout lung fields and diminished lung sounds.   Tobacco abuse taking chantix. Currently trying to quit smoking.    Plan: Patient prescribed albuterol inhaler for wheezing and cough, prednisone 20 mg tablet taper for inflammation relief, hydrocodone-homatropine 5-1.5mg /1075ml syrup PRN for cough at bedtime, azithromycin 250 mg tablet PO for pneumonia prophylaxis and treatment for bronchitis. Patient instructed to return to office or ED if symptoms worsen, patient experiences chest pain, syncope or shortness of breath. Side effects of medication explained and all questions answered. Patient conveyed understanding and agreed to current treatment plan.

## 2015-01-16 ENCOUNTER — Ambulatory Visit (INDEPENDENT_AMBULATORY_CARE_PROVIDER_SITE_OTHER): Payer: BLUE CROSS/BLUE SHIELD | Admitting: Family Medicine

## 2015-01-16 ENCOUNTER — Encounter: Payer: Self-pay | Admitting: Family Medicine

## 2015-01-16 ENCOUNTER — Other Ambulatory Visit: Payer: Self-pay | Admitting: Family Medicine

## 2015-01-16 VITALS — BP 109/70 | HR 68 | Wt 220.0 lb

## 2015-01-16 DIAGNOSIS — I10 Essential (primary) hypertension: Secondary | ICD-10-CM

## 2015-01-16 DIAGNOSIS — M7611 Psoas tendinitis, right hip: Secondary | ICD-10-CM | POA: Diagnosis not present

## 2015-01-16 DIAGNOSIS — R7303 Prediabetes: Secondary | ICD-10-CM

## 2015-01-16 DIAGNOSIS — I2510 Atherosclerotic heart disease of native coronary artery without angina pectoris: Secondary | ICD-10-CM

## 2015-01-16 LAB — COMPLETE METABOLIC PANEL WITH GFR
ALBUMIN: 4.1 g/dL (ref 3.6–5.1)
ALT: 38 U/L (ref 9–46)
AST: 18 U/L (ref 10–35)
Alkaline Phosphatase: 53 U/L (ref 40–115)
BUN: 17 mg/dL (ref 7–25)
CO2: 27 mmol/L (ref 20–31)
Calcium: 9.2 mg/dL (ref 8.6–10.3)
Chloride: 103 mmol/L (ref 98–110)
Creat: 0.98 mg/dL (ref 0.70–1.33)
GFR, Est African American: >89 mL/min (ref >=60)
GFR, Est Non African American: 85 mL/min (ref >=60)
GLUCOSE: 103 mg/dL — AB (ref 65–99)
POTASSIUM: 4.6 mmol/L (ref 3.5–5.3)
SODIUM: 139 mmol/L (ref 135–146)
Total Bilirubin: 0.8 mg/dL (ref 0.2–1.2)
Total Protein: 6.8 g/dL (ref 6.1–8.1)

## 2015-01-16 LAB — LIPID PANEL
Cholesterol: 253 mg/dL — ABNORMAL HIGH (ref 125–200)
HDL: 33 mg/dL — ABNORMAL LOW (ref >=40)
LDL Cholesterol: 183 mg/dL — ABNORMAL HIGH (ref <130)
Total CHOL/HDL Ratio: 7.7 Ratio — ABNORMAL HIGH (ref <=5.0)
Triglycerides: 185 mg/dL — ABNORMAL HIGH (ref <150)
VLDL: 37 mg/dL — ABNORMAL HIGH (ref <30)

## 2015-01-16 MED ORDER — TEMAZEPAM 30 MG PO CAPS: 30.0000 mg | ORAL_CAPSULE | Freq: Every evening | ORAL | Status: DC | PRN

## 2015-01-16 MED ORDER — PITAVASTATIN CALCIUM 2 MG PO TABS: ORAL_TABLET | ORAL | Status: DC

## 2015-01-16 MED ORDER — METOPROLOL SUCCINATE ER 50 MG PO TB24: ORAL_TABLET | ORAL | Status: DC

## 2015-01-16 MED ORDER — PRASUGREL HCL 10 MG PO TABS: 10.0000 mg | ORAL_TABLET | Freq: Every day | ORAL | Status: DC

## 2015-01-16 MED ORDER — HYDROCODONE-ACETAMINOPHEN 10-325 MG PO TABS: 0.5000 | ORAL_TABLET | Freq: Three times a day (TID) | ORAL | Status: DC | PRN

## 2015-01-16 MED ORDER — LISINOPRIL-HYDROCHLOROTHIAZIDE 20-12.5 MG PO TABS: 1.0000 | ORAL_TABLET | Freq: Every day | ORAL | Status: DC

## 2015-01-16 NOTE — Progress Notes (Signed)
CC: Vincent Wall is a 54 y.o. male is here for Hypertension; Labs Only; and Medication Refill   Subjective: HPI:   Hypertension: Continues to take lisinopril-hydrochlorothiazide on a daily basis with no known side effects. No outside blood pressures report. Denies chest pain shortness of breath orthopnea nor peripheral edema  Follow-up FAI:  Continues to take hydrocodone only 1-2 times a week for his right hip pain. He gets pain in the anterior aspect of the groin if seated for long periods of time or when doing strenuous activity like shoveling snow. Pain has not changed in character severity or frequency since I saw him last. He is not interested in pursuing surgical correction.  Follow-up prediabetes: No outside blood sugars report. Denies polyuria polyphagia or polydipsia. It's been greater than 3 months since his last A1c  Follow-up CAD: He denies any exertional chest pain or chest pain whatsoever. He's continuing to take ASA and Effient. Denies any irregular heartbeat.  Requesting a refill on Restoril help with sleep  Review Of Systems Outlined In HPI  Past Medical History  Diagnosis Date  . Heart disease   . Heart attack (HCC)   . Hypertension   . Hyperlipidemia     Past Surgical History  Procedure Laterality Date  . Coronary stent placement  05/2010  . Elbow surgery  2010   Family History  Problem Relation Age of Onset  . Breast cancer    . Heart attack    . Diabetes    . Hyperlipidemia    . Hypertension      Social History   Social History  . Marital Status: Unknown    Spouse Name: N/A  . Number of Children: N/A  . Years of Education: N/A   Occupational History  . Not on file.   Social History Main Topics  . Smoking status: Current Every Day Smoker -- 1.00 packs/day for 20 years    Types: Cigarettes  . Smokeless tobacco: Not on file  . Alcohol Use: 0.5 oz/week    1 drink(s) per week  . Drug Use: No  . Sexual Activity:    Partners: Female   Other  Topics Concern  . Not on file   Social History Narrative     Objective: BP 109/70 mmHg  Pulse 68  Wt 220 lb (99.791 kg)  General: Alert and Oriented, No Acute Distress HEENT: Pupils equal, round, reactive to light. Conjunctivae clear.  Moist membranes Lungs: Clear to auscultation bilaterally, no wheezing/ronchi/rales.  Comfortable work of breathing. Good air movement. Cardiac: Regular rate and rhythm. Normal S1/S2.  No murmurs, rubs, nor gallops.   Extremities: No peripheral edema.  Strong peripheral pulses.  Mental Status: No depression, anxiety, nor agitation. Skin: Warm and dry.  Assessment & Plan: Vincent Wall was seen today for hypertension, labs only and medication refill.  Diagnoses and all orders for this visit:  Essential hypertension, benign -     COMPLETE METABOLIC PANEL WITH GFR -     metoprolol succinate (TOPROL-XL) 50 MG 24 hr tablet; TAKE 1 TABLET (50 MG TOTAL) BY MOUTH DAILY. TAKE WITH OR IMMEDIATELY FOLLOWING A MEAL.  Femoral acetabular impingement, right -     HYDROcodone-acetaminophen (NORCO) 10-325 MG tablet; Take 0.5-1 tablets by mouth every 8 (eight) hours as needed for moderate pain.  Prediabetes -     Hemoglobin A1c  Coronary artery disease involving native coronary artery of native heart without angina pectoris -     Lipid panel -     Pitavastatin  Calcium (LIVALO) 2 MG TABS; One by mouth daily.  Other orders -     lisinopril-hydrochlorothiazide (PRINZIDE,ZESTORETIC) 20-12.5 MG tablet; Take 1 tablet by mouth daily. -     prasugrel (EFFIENT) 10 MG TABS tablet; Take 1 tablet (10 mg total) by mouth daily. -     temazepam (RESTORIL) 30 MG capsule; Take 1 capsule (30 mg total) by mouth at bedtime as needed for sleep.   Essential hypertension: Controlled continue lisinopril-hydrochlorothiazide, checking renal function FAI: Controlled with sparing use of hydrocodone Prediabetes: Clinically controlled but due for repeat A1c CAD: Stable, due for repeat lipid  panel, continue effient, submitting prescription for livalo since his insurance changed and it might have coverage for this now and he is intolerant of multiple other statins.  Return in about 6 months (around 07/16/2015).

## 2015-01-17 LAB — HEMOGLOBIN A1C
HEMOGLOBIN A1C: 5.9 % — AB (ref <5.7)
MEAN PLASMA GLUCOSE: 123 mg/dL — AB (ref <117)

## 2015-01-18 LAB — PSA: PSA: 1.05 ng/mL (ref <=4.00)

## 2015-01-28 ENCOUNTER — Telehealth: Payer: Self-pay | Admitting: *Deleted

## 2015-01-28 NOTE — Telephone Encounter (Signed)
Care everywhere opened.Vincent Wall [place]

## 2015-02-28 ENCOUNTER — Encounter: Payer: Self-pay | Admitting: Family Medicine

## 2015-03-07 ENCOUNTER — Telehealth: Payer: Self-pay | Admitting: Family Medicine

## 2015-03-07 DIAGNOSIS — I10 Essential (primary) hypertension: Secondary | ICD-10-CM

## 2015-03-07 MED ORDER — PRASUGREL HCL 10 MG PO TABS: 10.0000 mg | ORAL_TABLET | Freq: Every day | ORAL | Status: DC

## 2015-03-07 MED ORDER — METOPROLOL SUCCINATE ER 50 MG PO TB24: ORAL_TABLET | ORAL | Status: DC

## 2015-03-07 NOTE — Telephone Encounter (Signed)
Refill request

## 2015-03-26 ENCOUNTER — Telehealth: Payer: Self-pay | Admitting: *Deleted

## 2015-03-26 NOTE — Telephone Encounter (Signed)
A letter of medical Arther Damesnec was sent for livalo. Checked the status and the medication has been approved and there has been a paid claim on 3/14 meaning the patient has picked up the medication for the month.  approval 3 [id number]

## 2015-05-06 ENCOUNTER — Other Ambulatory Visit: Payer: Self-pay

## 2015-05-06 DIAGNOSIS — I2510 Atherosclerotic heart disease of native coronary artery without angina pectoris: Secondary | ICD-10-CM

## 2015-05-06 MED ORDER — PITAVASTATIN CALCIUM 2 MG PO TABS: ORAL_TABLET | ORAL | Status: DC

## 2015-05-07 ENCOUNTER — Other Ambulatory Visit: Payer: Self-pay

## 2015-05-07 DIAGNOSIS — I2510 Atherosclerotic heart disease of native coronary artery without angina pectoris: Secondary | ICD-10-CM

## 2015-05-07 MED ORDER — PITAVASTATIN CALCIUM 2 MG PO TABS: ORAL_TABLET | ORAL | Status: DC

## 2015-06-06 ENCOUNTER — Encounter: Payer: Self-pay | Admitting: Family Medicine

## 2015-06-06 ENCOUNTER — Ambulatory Visit (INDEPENDENT_AMBULATORY_CARE_PROVIDER_SITE_OTHER): Payer: Managed Care, Other (non HMO) | Admitting: Family Medicine

## 2015-06-06 VITALS — BP 133/81 | HR 94 | Temp 98.3°F | Wt 221.0 lb

## 2015-06-06 DIAGNOSIS — J189 Pneumonia, unspecified organism: Secondary | ICD-10-CM | POA: Diagnosis not present

## 2015-06-06 MED ORDER — METHYLPREDNISOLONE ACETATE 80 MG/ML IJ SUSP
80.0000 mg | Freq: Once | INTRAMUSCULAR | Status: AC
  Administered 2015-06-06: 80 mg via INTRAMUSCULAR

## 2015-06-06 MED ORDER — LEVOFLOXACIN 500 MG PO TABS: 500.0000 mg | ORAL_TABLET | Freq: Every day | ORAL | Status: DC

## 2015-06-06 NOTE — Progress Notes (Signed)
CC: Rennie NatterKevin Katich is a 55 y.o. male is here for Fever; Chills; and Nasal Congestion   Subjective: HPI:  Son is currently undergoing therapy for lymphoma, it is not going well  The past week he's been experiencing nasal congestion, sore throat, postnasal drip and productive cough. He has occasional shortness of breath and wheezing. He denies any chest discomfort or coughing up blood. He denies any confusion or headaches. No benefit from Allegra-D. No other interventions as of yet. Symptoms are present all hours the day and slowly worsening   Review Of Systems Outlined In HPI  Past Medical History  Diagnosis Date  . Heart disease   . Heart attack (HCC)   . Hypertension   . Hyperlipidemia     Past Surgical History  Procedure Laterality Date  . Coronary stent placement  05/2010  . Elbow surgery  2010   Family History  Problem Relation Age of Onset  . Breast cancer    . Heart attack    . Diabetes    . Hyperlipidemia    . Hypertension      Social History   Social History  . Marital Status: Unknown    Spouse Name: N/A  . Number of Children: N/A  . Years of Education: N/A   Occupational History  . Not on file.   Social History Main Topics  . Smoking status: Current Every Day Smoker -- 1.00 packs/day for 20 years    Types: Cigarettes  . Smokeless tobacco: Not on file  . Alcohol Use: 0.5 oz/week    1 drink(s) per week  . Drug Use: No  . Sexual Activity:    Partners: Female   Other Topics Concern  . Not on file   Social History Narrative     Objective: BP 133/81 mmHg  Pulse 94  Temp(Src) 98.3 F (36.8 C) (Oral)  Wt 221 lb (100.245 kg)  SpO2 93%  General: Alert and Oriented, No Acute Distress HEENT: Pupils equal, round, reactive to light. Conjunctivae clear.  External ears unremarkable, canals clear with intact TMs with appropriate landmarks.  Middle ear appears open without effusion. Pink inferior turbinates.  Moist mucous membranes, pharynx without  inflammation nor lesions.  Neck supple without palpable lymphadenopathy nor abnormal masses. Lungs: Work of breathing with occasional coughing. No wheezing or rails however he has rhonchi in the right posterior inferior lung field Cardiac: Regular rate and rhythm. Normal S1/S2.  No murmurs, rubs, nor gallops.   Extremities: No peripheral edema.  Strong peripheral pulses.  Mental Status: No depression, anxiety, nor agitation. Skin: Warm and dry.  Assessment & Plan: [name] was seen today for fever, chills and nasal congestion.  Diagnoses and all orders for this visit:  CAP (community acquired pneumonia) -     levofloxacin (LEVAQUIN) 500 MG tablet; Take 1 tablet (500 mg total) by mouth daily.   Concern for community acquired pneumonia therefore start levofloxacin and he received a 80 mg injection of Depo-Medrol today.Signs and symptoms requring emergent/urgent reevaluation were discussed with the patient.   No Follow-up on file.

## 2015-06-06 NOTE — Addendum Note (Signed)
Addended by: Thom ChimesHENRY, Khyre Germond M on: 06/06/2015 04:41 PM   Modules accepted: Orders

## 2015-07-16 ENCOUNTER — Encounter: Payer: Self-pay | Admitting: Family Medicine

## 2015-07-16 ENCOUNTER — Ambulatory Visit (INDEPENDENT_AMBULATORY_CARE_PROVIDER_SITE_OTHER): Payer: BLUE CROSS/BLUE SHIELD | Admitting: Family Medicine

## 2015-07-16 VITALS — BP 124/81 | HR 63 | Wt 223.0 lb

## 2015-07-16 DIAGNOSIS — I10 Essential (primary) hypertension: Secondary | ICD-10-CM

## 2015-07-16 DIAGNOSIS — E785 Hyperlipidemia, unspecified: Secondary | ICD-10-CM

## 2015-07-16 DIAGNOSIS — M7611 Psoas tendinitis, right hip: Secondary | ICD-10-CM

## 2015-07-16 DIAGNOSIS — R7303 Prediabetes: Secondary | ICD-10-CM | POA: Diagnosis not present

## 2015-07-16 DIAGNOSIS — Z1159 Encounter for screening for other viral diseases: Secondary | ICD-10-CM | POA: Diagnosis not present

## 2015-07-16 DIAGNOSIS — I2510 Atherosclerotic heart disease of native coronary artery without angina pectoris: Secondary | ICD-10-CM

## 2015-07-16 LAB — LIPID PANEL
CHOL/HDL RATIO: 4.5 ratio (ref <=5.0)
Cholesterol: 181 mg/dL (ref 125–200)
HDL: 40 mg/dL (ref >=40)
LDL CALC: 108 mg/dL (ref <130)
TRIGLYCERIDES: 163 mg/dL — AB (ref <150)
VLDL: 33 mg/dL — AB (ref <30)

## 2015-07-16 LAB — POCT GLYCOSYLATED HEMOGLOBIN (HGB A1C): Hemoglobin A1C: 5.7

## 2015-07-16 MED ORDER — METOPROLOL SUCCINATE ER 50 MG PO TB24: ORAL_TABLET | ORAL | Status: DC

## 2015-07-16 MED ORDER — HYDROCODONE-ACETAMINOPHEN 10-325 MG PO TABS: 0.5000 | ORAL_TABLET | Freq: Three times a day (TID) | ORAL | Status: DC | PRN

## 2015-07-16 MED ORDER — NITROGLYCERIN 0.4 MG SL SUBL: 0.4000 mg | SUBLINGUAL_TABLET | SUBLINGUAL | Status: DC | PRN

## 2015-07-16 MED ORDER — PITAVASTATIN CALCIUM 2 MG PO TABS: ORAL_TABLET | ORAL | Status: DC

## 2015-07-16 MED ORDER — LISINOPRIL-HYDROCHLOROTHIAZIDE 20-12.5 MG PO TABS: 1.0000 | ORAL_TABLET | Freq: Every day | ORAL | Status: DC

## 2015-07-16 MED ORDER — ZOLPIDEM TARTRATE 10 MG PO TABS: 5.0000 mg | ORAL_TABLET | Freq: Every evening | ORAL | Status: DC | PRN

## 2015-07-16 NOTE — Progress Notes (Signed)
CC: Vincent NatterKevin Molloy is a 55 y.o. male is here for Hypertension   Subjective: HPI:  Follow-up essential hypertension: He is taking lisinopril-hydrochlorothiazide with 100% compliance along with metoprolol. He has no outside blood pressures report. He denies chest pain shortness of breath orthopnea nor peripheral edema.  It's been greater than 6 months since his cholesterol was checked last. He is takinglivalo without any intolerance such as right upper quadrant pain or myalgias. He denies any chest pain or limb claudication  He would like a refill on his hydrocodone that he takes for his right-sided femoral acetabular impingement. He tells me he only has to take this a few times a month if he is overdone it with his job. Last prescription has lasted ever since January.  Follow-up prediabetes: He is due to have his blood sugar checked. He denies any polyuria or polyphagia or polydipsia.  He's like a refill on his nitroglycerin. Thankfully he's never had to take this but he lost his most recent prescription of nitroglycerin. He denies any chest pain or angina   Review Of Systems Outlined In HPI  Past Medical History  Diagnosis Date  . Heart disease   . Heart attack (HCC)   . Hypertension   . Hyperlipidemia     Past Surgical History  Procedure Laterality Date  . Coronary stent placement  05/2010  . Elbow surgery  2010   Family History  Problem Relation Age of Onset  . Breast cancer    . Heart attack    . Diabetes    . Hyperlipidemia    . Hypertension      Social History   Social History  . Marital Status: Unknown    Spouse Name: N/A  . Number of Children: N/A  . Years of Education: N/A   Occupational History  . Not on file.   Social History Main Topics  . Smoking status: Current Every Day Smoker -- 1.00 packs/day for 20 years    Types: Cigarettes  . Smokeless tobacco: Not on file  . Alcohol Use: 0.5 oz/week    1 drink(s) per week  . Drug Use: No  . Sexual Activity:    Partners: Female   Other Topics Concern  . Not on file   Social History Narrative     Objective: BP 124/81 mmHg  Pulse 63  Wt 223 lb (101.152 kg)  General: Alert and Oriented, No Acute Distress HEENT: Pupils equal, round, reactive to light. Conjunctivae clear.  Moist mucous membranes Lungs: Clear to auscultation bilaterally, no wheezing/ronchi/rales.  Comfortable work of breathing. Good air movement. Cardiac: Regular rate and rhythm. Normal S1/S2.  No murmurs, rubs, nor gallops.   Abdomen: Mild obesity Extremities: No peripheral edema.  Strong peripheral pulses.  Mental Status: No depression, anxiety, nor agitation. Skin: Warm and dry.  Assessment & Plan: [name] was seen today for hypertension.  Diagnoses and all orders for this visit:  Essential hypertension, benign -     metoprolol succinate (TOPROL-XL) 50 MG 24 hr tablet; TAKE 1 TABLET (50 MG TOTAL) BY MOUTH DAILY. TAKE WITH OR IMMEDIATELY FOLLOWING A MEAL.  Hyperlipidemia -     Lipid panel  Femoral acetabular impingement, right -     HYDROcodone-acetaminophen (NORCO) 10-325 MG tablet; Take 0.5-1 tablets by mouth every 8 (eight) hours as needed for moderate pain.  Prediabetes -     POCT HgB A1C  Need for hepatitis C screening test -     Hepatitis C antibody  Coronary artery disease involving  native coronary artery of native heart without angina pectoris -     Pitavastatin Calcium (LIVALO) 2 MG TABS; One by mouth daily.  Other orders -     nitroGLYCERIN (NITROSTAT) 0.4 MG SL tablet; Place 1 tablet (0.4 mg total) under the tongue every 5 (five) minutes as needed for chest pain. -     zolpidem (AMBIEN) 10 MG tablet; Take 0.5-1 tablets (5-10 mg total) by mouth at bedtime as needed for sleep. -     lisinopril-hydrochlorothiazide (PRINZIDE,ZESTORETIC) 20-12.5 MG tablet; Take 1 tablet by mouth daily.   Essential hypertension: Controlled continue metoprolol and lisinopril-Hydrocort thiazide Hyperlipidemia: Due for lipid  panel today continue livalo pending results FAI: Continue sparing responsible use of hydrocodone Coronary artery disease: Currently stable continue aspirin, effient and beta blocker. Insomnia: Changing temazepam to Ambien since his insurance was only allowing him to have 15 of the former a month Prediabetes: A1 5.7, controlled  No Follow-up on file.

## 2015-07-17 LAB — HEPATITIS C ANTIBODY: HCV AB: NEGATIVE

## 2015-07-17 MED ORDER — NIACIN ER (ANTIHYPERLIPIDEMIC) 1000 MG PO TBCR: 1000.0000 mg | EXTENDED_RELEASE_TABLET | Freq: Every day | ORAL | Status: DC

## 2015-07-17 NOTE — Addendum Note (Signed)
Addended by: Monica BectonHEKKEKANDAM, THOMAS J on: 07/17/2015 01:24 PM   Modules accepted: Orders, SmartSet

## 2015-08-21 ENCOUNTER — Other Ambulatory Visit: Payer: Self-pay | Admitting: Family Medicine

## 2015-08-22 ENCOUNTER — Telehealth: Payer: Self-pay

## 2015-08-22 DIAGNOSIS — Z7982 Long term (current) use of aspirin: Secondary | ICD-10-CM | POA: Diagnosis not present

## 2015-08-22 DIAGNOSIS — M25511 Pain in right shoulder: Secondary | ICD-10-CM | POA: Diagnosis not present

## 2015-08-22 DIAGNOSIS — I252 Old myocardial infarction: Secondary | ICD-10-CM | POA: Diagnosis not present

## 2015-08-22 DIAGNOSIS — S43006A Unspecified dislocation of unspecified shoulder joint, initial encounter: Secondary | ICD-10-CM

## 2015-08-22 DIAGNOSIS — I2510 Atherosclerotic heart disease of native coronary artery without angina pectoris: Secondary | ICD-10-CM | POA: Diagnosis not present

## 2015-08-22 DIAGNOSIS — S43101A Unspecified dislocation of right acromioclavicular joint, initial encounter: Secondary | ICD-10-CM | POA: Diagnosis not present

## 2015-08-22 DIAGNOSIS — Z79899 Other long term (current) drug therapy: Secondary | ICD-10-CM | POA: Diagnosis not present

## 2015-08-22 DIAGNOSIS — Z7902 Long term (current) use of antithrombotics/antiplatelets: Secondary | ICD-10-CM | POA: Diagnosis not present

## 2015-08-22 DIAGNOSIS — Z888 Allergy status to other drugs, medicaments and biological substances status: Secondary | ICD-10-CM | POA: Diagnosis not present

## 2015-08-22 DIAGNOSIS — F17210 Nicotine dependence, cigarettes, uncomplicated: Secondary | ICD-10-CM | POA: Diagnosis not present

## 2015-08-22 DIAGNOSIS — I10 Essential (primary) hypertension: Secondary | ICD-10-CM | POA: Diagnosis not present

## 2015-08-22 DIAGNOSIS — W06XXXA Fall from bed, initial encounter: Secondary | ICD-10-CM | POA: Diagnosis not present

## 2015-08-22 DIAGNOSIS — E785 Hyperlipidemia, unspecified: Secondary | ICD-10-CM | POA: Diagnosis not present

## 2015-08-22 DIAGNOSIS — F419 Anxiety disorder, unspecified: Secondary | ICD-10-CM | POA: Diagnosis not present

## 2015-08-22 DIAGNOSIS — Z955 Presence of coronary angioplasty implant and graft: Secondary | ICD-10-CM | POA: Diagnosis not present

## 2015-08-22 NOTE — Telephone Encounter (Signed)
Referral to [facility] has been placed.

## 2015-08-22 NOTE — Telephone Encounter (Signed)
Pt.notified

## 2015-08-27 DIAGNOSIS — M24811 Other specific joint derangements of right shoulder, not elsewhere classified: Secondary | ICD-10-CM | POA: Diagnosis not present

## 2015-09-02 ENCOUNTER — Encounter: Payer: Self-pay | Admitting: Family Medicine

## 2015-09-02 ENCOUNTER — Ambulatory Visit (INDEPENDENT_AMBULATORY_CARE_PROVIDER_SITE_OTHER): Payer: BLUE CROSS/BLUE SHIELD | Admitting: Family Medicine

## 2015-09-02 VITALS — BP 120/59 | HR 78 | Temp 97.9°F | Resp 18 | Wt 219.1 lb

## 2015-09-02 DIAGNOSIS — M722 Plantar fascial fibromatosis: Secondary | ICD-10-CM

## 2015-09-02 NOTE — Progress Notes (Signed)
Vincent Wall is a 55 y.o. male who presents to [name] Primary Care [facility] today for notes continued bothersome plantar fascial pain in the right foot. He's had orthotics and a plantar fascial injection in February 2017. The plantar fascia injection worked for several months. The pain is slowly been worsening over the last several months it has become very bothersome. He notes pain with ambulation which limits his walking. He would like a repeat injection if possible. He denies any new injury.  Of note patient's son currently is undergoing treatment for Hodgkin's lymphoma and is scheduled for a stem cell transplant in the near future.   Past Medical History:  Diagnosis Date  . Heart attack (HCC)   . Heart disease   . Hyperlipidemia   . Hypertension    Past Surgical History:  Procedure Laterality Date  . CORONARY STENT PLACEMENT  05/2010  . ELBOW SURGERY  2010   Social History  Substance Use Topics  . Smoking status: Current Every Day Smoker    Packs/day: 1.00    Years: 20.00    Types: Cigarettes  . Smokeless tobacco: Not on file  . Alcohol use 0.5 oz/week    1 Standard drinks or equivalent per week   family history is not on file.  ROS as above:  Medications: Current Outpatient Prescriptions  Medication Sig Dispense Refill  . albuterol (PROVENTIL HFA;VENTOLIN HFA) 108 (90 BASE) MCG/ACT inhaler Inhale 2 puffs into the lungs every 6 (six) hours as needed for wheezing or shortness of breath. 1 Inhaler 3  . aspirin 81 MG tablet Take 81 mg by mouth daily.    Marland Kitchen HYDROcodone-acetaminophen (NORCO) 10-325 MG tablet Take 0.5-1 tablets by mouth every 8 (eight) hours as needed for moderate pain. 60 tablet 0  . lisinopril-hydrochlorothiazide (PRINZIDE,ZESTORETIC) 20-12.5 MG tablet Take 1 tablet by mouth daily. 90 tablet 2  . metoprolol succinate (TOPROL-XL) 50 MG 24 hr tablet TAKE 1  TABLET (50 MG TOTAL) BY MOUTH DAILY. TAKE WITH OR IMMEDIATELY FOLLOWING A MEAL. 90 tablet 2  . niacin (NIASPAN) 1000 MG CR tablet Take 1 tablet (1,000 mg total) by mouth at bedtime. 30 tablet 3  . nitroGLYCERIN (NITROSTAT) 0.4 MG SL tablet Place 1 tablet (0.4 mg total) under the tongue every 5 (five) minutes as needed for chest pain. 50 tablet 3  . omeprazole (PRILOSEC) 20 MG capsule Take 20 mg by mouth daily.    . Pitavastatin Calcium (LIVALO) 2 MG TABS One by mouth daily. 90 tablet 1  . prasugrel (EFFIENT) 10 MG TABS tablet Take 1 tablet (10 mg total) by mouth daily. 90 tablet 3  . temazepam (RESTORIL) 30 MG capsule TAKE 1 CAP BY MOUTH AT BEDTIME AS NEEDED FOR SLEEP 30 capsule 2  . zolpidem (AMBIEN) 10 MG tablet Take 0.5-1 tablets (5-10 mg total) by mouth at bedtime as needed for sleep. 30 tablet 1   No current facility-administered medications for this visit.    Allergies  Allergen Reactions  . Crestor [Rosuvastatin]     Myalgia    . Lipitor [Atorvastatin]     Myalgias  . Statins     Joint pain; pt states his body "locked up"     Exam:  BP (!) 120/59 (BP Location: Right Arm, Patient Position: Sitting, Cuff Size: Large)   Pulse 78   Temp 97.9 F (36.6 C) (Oral)   Resp 18   Wt 219 lb 1.9 oz (99.4 kg)   SpO2  99%   BMI 33.32 kg/m  Gen: Well NAD Right foot: Normal-appearing. Tender palpation plantar calcaneus. Pulses capillary refill and sensation intact.  Procedure: Real-time Ultrasound Guided Injection of right plantar fascia  Device: GE Logiq E  Images permanently stored and available for review in the ultrasound unit. Verbal informed consent obtained. Discussed risks and benefits of procedure. Warned about infection bleeding damage to structures skin hypopigmentation and fat atrophy among others. Patient expresses understanding and agreement Time-out conducted.  Noted no overlying erythema, induration, or other signs of local infection.  Skin prepped in a sterile  fashion.  Local anesthesia: Topical Ethyl chloride.  With sterile technique and under real time ultrasound guidance: 40 mg of Kenalog and 20 mL of Marcaine injected easily.  Completed without difficulty  Pain immediately resolved suggesting accurate placement of the medication.  Advised to call if fevers/chills, erythema, induration, drainage, or persistent bleeding.  Images permanently stored and available for review in the ultrasound unit.  Impression: Technically successful ultrasound guided injection.  Lot number: Marcaine [id number] Kenalog [id number]    No results found for this or any previous visit (from the past 24 hour(s)). No results found.    Assessment and Plan: 55 y.o. male with  Plantar fasciitis. Plan to repeat injection today. Continue home exercise program. Return as needed.   No orders of the defined types were placed in this encounter.   Discussed warning signs or symptoms. Please see discharge instructions. Patient expresses understanding.

## 2015-09-02 NOTE — Patient Instructions (Signed)
Thank you for coming in today. Return as needed.  Let me know when you get the flu shot this year.  Call or go to the ER if you develop a large red swollen joint with extreme pain or oozing puss.   Continue the exercises.

## 2015-09-02 NOTE — Progress Notes (Signed)
Pt here with right heel pain.

## 2015-10-13 ENCOUNTER — Other Ambulatory Visit: Payer: Self-pay

## 2015-10-13 MED ORDER — HYDROCODONE-ACETAMINOPHEN 10-325 MG PO TABS: 0.5000 | ORAL_TABLET | Freq: Three times a day (TID) | ORAL | 0 refills | Status: DC | PRN

## 2015-12-15 ENCOUNTER — Telehealth: Payer: Self-pay | Admitting: Family Medicine

## 2015-12-15 NOTE — Telephone Encounter (Signed)
Yes

## 2015-12-15 NOTE — Telephone Encounter (Signed)
Patient called adv that he was a prevs [facility] patient and he has seen you before for Ortho and would like to know if you can be his primary physician since Dr. Ivan AnchorsHommel is no longer here. I adv pt will send a message and call him back. Thanks

## 2015-12-17 ENCOUNTER — Ambulatory Visit (INDEPENDENT_AMBULATORY_CARE_PROVIDER_SITE_OTHER): Payer: BLUE CROSS/BLUE SHIELD | Admitting: Family Medicine

## 2015-12-17 ENCOUNTER — Ambulatory Visit (INDEPENDENT_AMBULATORY_CARE_PROVIDER_SITE_OTHER): Payer: BLUE CROSS/BLUE SHIELD

## 2015-12-17 ENCOUNTER — Encounter: Payer: Self-pay | Admitting: Family Medicine

## 2015-12-17 VITALS — BP 137/84 | HR 82 | Wt 214.0 lb

## 2015-12-17 DIAGNOSIS — M545 Low back pain, unspecified: Secondary | ICD-10-CM

## 2015-12-17 DIAGNOSIS — I700 Atherosclerosis of aorta: Secondary | ICD-10-CM | POA: Diagnosis not present

## 2015-12-17 DIAGNOSIS — S79912A Unspecified injury of left hip, initial encounter: Secondary | ICD-10-CM | POA: Diagnosis not present

## 2015-12-17 DIAGNOSIS — M5117 Intervertebral disc disorders with radiculopathy, lumbosacral region: Secondary | ICD-10-CM | POA: Diagnosis not present

## 2015-12-17 DIAGNOSIS — M25552 Pain in left hip: Secondary | ICD-10-CM | POA: Diagnosis not present

## 2015-12-17 DIAGNOSIS — M25859 Other specified joint disorders, unspecified hip: Secondary | ICD-10-CM

## 2015-12-17 MED ORDER — HYDROCODONE-ACETAMINOPHEN 10-325 MG PO TABS: 0.5000 | ORAL_TABLET | Freq: Three times a day (TID) | ORAL | 0 refills | Status: DC | PRN

## 2015-12-17 NOTE — Progress Notes (Signed)
Vincent Wall is a 55 y.o. male who presents to [name]: Primary Care Sports Medicine today for establish care and discuss back and hip pain. Patient's previous primary care doctor has left the practice. He wishes to establish care for his medical issues. However he notes new onset of back and hip pain today that he like addressed.  Back pain: Patient has a history of intermittent back pain in the past however over the last 3-4 weeks he's noted severe left low back pain. He does have some pain radiating to the anterior thigh but nonradiating below the level of the leg. He notes the back pain is much better with a flexed position. Pain is worse with standing and laying flat. He notes that he fell from a chair a few weeks ago but didn't have immediate pain after the fall. The pain started suddenly with normal activity. He notes the pain is quite severe at times. He's been using leftover hydrocodone for pain control which does help. He denies any bowel or bladder dysfunction.  Hip pain: Patient also notes pain in the left anterior groin radiating to the anterior thigh. This is associated with subjective weakness to hip flexion. He notes the pain has been ongoing now for a week or 2 and seems to coincide with his back pain. He's been told that he may have hip arthritis in the past but usually doesn't have much pain. He also has been taking hydrocodone for this pain which does help.    Past Medical History:  Diagnosis Date  . Heart attack   . Heart disease   . Hyperlipidemia   . Hypertension    Past Surgical History:  Procedure Laterality Date  . CORONARY STENT PLACEMENT  05/2010  . ELBOW SURGERY  2010   Social History  Substance Use Topics  . Smoking status: Current Every Day Smoker    Packs/day: 1.00    Years: 20.00    Types: Cigarettes  . Smokeless tobacco: Not on file  . Alcohol use 0.5 oz/week      1 Standard drinks or equivalent per week   family history is not on file.  ROS as above:  Medications: Current Outpatient Prescriptions  Medication Sig Dispense Refill  . albuterol (PROVENTIL HFA;VENTOLIN HFA) 108 (90 BASE) MCG/ACT inhaler Inhale 2 puffs into the lungs every 6 (six) hours as needed for wheezing or shortness of breath. 1 Inhaler 3  . aspirin 81 MG tablet Take 81 mg by mouth daily.    Marland Kitchen HYDROcodone-acetaminophen (NORCO) 10-325 MG tablet Take 0.5-1 tablets by mouth every 8 (eight) hours as needed for moderate pain. 60 tablet 0  . lisinopril-hydrochlorothiazide (PRINZIDE,ZESTORETIC) 20-12.5 MG tablet Take 1 tablet by mouth daily. 90 tablet 2  . metoprolol succinate (TOPROL-XL) 50 MG 24 hr tablet TAKE 1 TABLET (50 MG TOTAL) BY MOUTH DAILY. TAKE WITH OR IMMEDIATELY FOLLOWING A MEAL. 90 tablet 2  . nitroGLYCERIN (NITROSTAT) 0.4 MG SL tablet Place 1 tablet (0.4 mg total) under the tongue every 5 (five) minutes as needed for chest pain. 50 tablet 3  . omeprazole (PRILOSEC) 20 MG capsule Take 20 mg by mouth daily.    . Pitavastatin Calcium (LIVALO) 2 MG TABS One by mouth daily. 90 tablet 1  . prasugrel (EFFIENT) 10 MG TABS tablet Take 1 tablet (10 mg total) by mouth daily. 90 tablet 3  . temazepam (RESTORIL) 30 MG capsule TAKE 1 CAP BY MOUTH AT BEDTIME AS NEEDED FOR  SLEEP 30 capsule 2   No current facility-administered medications for this visit.    Allergies  Allergen Reactions  . Crestor [Rosuvastatin]     Myalgia    . Lipitor [Atorvastatin]     Myalgias  . Statins     Joint pain; pt states his body "locked up"    Health Maintenance Health Maintenance  Topic Date Due  . HIV Screening  09/02/2018 (Originally 01/05/1975)  . INFLUENZA VACCINE  09/05/2018 (Originally 08/05/2015)  . COLONOSCOPY  01/05/2023 (Originally 01/06/2008)  . TETANUS/TDAP  12/06/2018  . Hepatitis C Screening  Completed     Exam:  BP 137/84   Pulse 82   Wt 214 lb (97.1 kg)   BMI 32.54 kg/m   Gen: Well NAD HEENT: EOMI,  MMM Lungs: Normal work of breathing. CTABL Heart: RRR no MRG Abd: NABS, Soft. Nondistended, Nontender Exts: Brisk capillary refill, warm and well perfused.  MSK: Lumbar spine: Nontender to spinal midline. Tender palpation left lumbar paraspinals. Patient has normal back flexion but has pain with extension. He has intact sensation to his lower extremity. His strength is intact with the exception of selected hip flexion which is diminished and patient complains of pain with this testing. Left hip: Normal-appearing and nontender. Limited motion especially to internal rotation. Patient expresses pain with internal rotation testing.  Procedure: Real-time Ultrasound Guided Injection of left hip  Device: GE Logiq E  Images permanently stored and available for review in the ultrasound unit. Verbal informed consent obtained. Discussed risks and benefits of procedure. Warned about infection bleeding damage to structures skin hypopigmentation and fat atrophy among others. Patient expresses understanding and agreement Time-out conducted.  Noted no overlying erythema, induration, or other signs of local infection.  Skin prepped in a sterile fashion.  Local anesthesia: Topical Ethyl chloride.  With sterile technique and under real time ultrasound guidance: 80mg  Kenalog and 4 mL of Marcaine injected easily.  Completed without difficulty  Pain immediately resolved suggesting accurate placement of the medication.  Advised to call if fevers/chills, erythema, induration, drainage, or persistent bleeding.  Images permanently stored and available for review in the ultrasound unit.  Impression: Technically successful ultrasound guided injection.    No results found for this or any previous visit (from the past 72 hour(s)). Dg Lumbar Spine Complete  Result Date: 12/17/2015 CLINICAL DATA:  Left lower back pain radiating down left leg for 1-2 months. Larey SeatFell out of chair  1 month ago. EXAM: LUMBAR SPINE - COMPLETE 4+ VIEW COMPARISON:  None. FINDINGS: Degenerative disc disease changes at L5-S1 with disc space narrowing, vacuum disc and spurring. Normal alignment. No fracture. Atherosclerotic disease in the aorta and iliac vessels without visible aneurysm. IMPRESSION: No acute bony abnormality. Aortoiliac atherosclerosis. Electronically Signed   By: Charlett NoseKevin  Dover M.D.   On: 12/17/2015 09:13   Dg Hip Unilat With Pelvis 2-3 Views Left  Result Date: 12/17/2015 CLINICAL DATA:  LEFT lower back pain radiating to LEFT hip for 1-2 months, fell out of a chair 3 weeks ago EXAM: DG HIP (WITH OR WITHOUT PELVIS) 2-3V LEFT COMPARISON:  None FINDINGS: Osseous mineralization normal. Symmetric hip and SI joints. No fracture, dislocation, or bone destruction. IMPRESSION: Normal exam. Electronically Signed   By: Ulyses SouthwardMark  Boles M.D.   On: 12/17/2015 09:14      Assessment and Plan: 55 y.o. male with  Back pain: Based on symptoms very likely spinal stenosis or radiographically occult pars defect or facet DJD. Plan for physical therapy and Norco for pain  control. Recheck in the near future.  Left hip pain: Exacerbation of likely underlying probable impingement. Patient is much more comfortable with a very flexed position I think this is exacerbating underlying hip femoral acetabular impingement. Plan for steroid injection as noted above.  Aortic arthrosclerosis: Noted on x-ray today. This is associated with his vascular disease history. We'll follow this issues up in the near future when feeling better. Patient likely will benefit from abdominal ultrasound screening for aortic aneurysm.   Orders Placed This Encounter  Procedures  . DG Lumbar Spine Complete    Standing Status:   Future    Number of Occurrences:   1    Standing Expiration Date:   02/16/2017    Order Specific Question:   Reason for Exam (SYMPTOM  OR DIAGNOSIS REQUIRED)    Answer:   lumbago after fall 3 weeks ago radiating  to left anterior thigh    Order Specific Question:   [facility]    [facility]  . DG HIP UNILAT WITH PELVIS 2-3 VIEWS LEFT    Standing Status:   Future    Number of Occurrences:   1    Standing Expiration Date:   02/16/2017    Order Specific Question:   Reason for Exam (SYMPTOM  OR DIAGNOSIS REQUIRED)    Answer:   left anterior hip pain following fall 3 weeks ago    Order Specific Question:   [facility]?    [facility]   . Ambulatory referral to Physical Therapy    Referral Priority:   Routine    Referral Type:   Physical Medicine    Referral Reason:   Specialty Services Required    Requested Specialty:   Physical Therapy    Number of Visits Requested:   1    Discussed warning signs or symptoms. Please see discharge instructions. Patient expresses understanding.

## 2015-12-17 NOTE — Patient Instructions (Signed)
Thank you for coming in today. Attend PT.  Use norco sparingly.  Return in 2-4 weeks.  Come back or go to the emergency room if you notice new weakness new numbness problems walking or bowel or bladder problems. Call or go to the ER if you develop a large red swollen joint with extreme pain or oozing puss.    Spinal Stenosis Spinal stenosis is an abnormal narrowing of the canals of your spine (vertebrae). CAUSES  Spinal stenosis is caused by areas of bone pushing into the central canals of your vertebrae. This condition can be present at birth (congenital). It also may be caused by arthritic deterioration of your vertebrae (spinal degeneration).  SYMPTOMS   Pain that is generally worse with activities, particularly standing and walking.  Numbness, tingling, hot or cold sensations, weakness, or weariness in your legs.  Frequent episodes of falling.  A foot-slapping gait that leads to muscle weakness. DIAGNOSIS  Spinal stenosis is diagnosed with the use of magnetic resonance imaging (MRI) or computed tomography (CT). TREATMENT  Initial therapy for spinal stenosis focuses on the management of the pain and other symptoms associated with the condition. These therapies include:  Practicing postural changes to lessen pressure on your nerves.  Exercises to strengthen the core of your body.  Loss of excess body weight.  The use of nonsteroidal anti-inflammatory medicines to reduce swelling and inflammation in your nerves. When therapies to manage pain are not successful, surgery to treat spinal stenosis may be recommended. This surgery involves removing excess bone, which puts pressure on your nerve roots. During this surgery (laminectomy), the posterior boney arch (lamina) and excess bone around the facet joints are removed. This information is not intended to replace advice given to you by your health care provider. Make sure you discuss any questions you have with your health care  provider. Document Released: 03/13/2003 Document Revised: 01/11/2014 Document Reviewed: 03/31/2012 Elsevier Interactive Patient Education  2017 [employer]

## 2015-12-22 ENCOUNTER — Other Ambulatory Visit: Payer: Self-pay | Admitting: Family Medicine

## 2015-12-22 MED ORDER — TEMAZEPAM 30 MG PO CAPS: 30.0000 mg | ORAL_CAPSULE | Freq: Every evening | ORAL | 4 refills | Status: DC | PRN

## 2015-12-23 ENCOUNTER — Ambulatory Visit (INDEPENDENT_AMBULATORY_CARE_PROVIDER_SITE_OTHER): Payer: BLUE CROSS/BLUE SHIELD | Admitting: Family Medicine

## 2015-12-23 ENCOUNTER — Encounter: Payer: Self-pay | Admitting: Family Medicine

## 2015-12-23 VITALS — BP 130/75 | HR 97

## 2015-12-23 DIAGNOSIS — M25859 Other specified joint disorders, unspecified hip: Secondary | ICD-10-CM

## 2015-12-23 DIAGNOSIS — M5442 Lumbago with sciatica, left side: Secondary | ICD-10-CM

## 2015-12-23 DIAGNOSIS — M25552 Pain in left hip: Secondary | ICD-10-CM

## 2015-12-23 MED ORDER — PREDNISONE 10 MG (48) PO TBPK: ORAL_TABLET | Freq: Every day | ORAL | 0 refills | Status: DC

## 2015-12-23 MED ORDER — HYDROCODONE-ACETAMINOPHEN 10-325 MG PO TABS: 0.5000 | ORAL_TABLET | Freq: Three times a day (TID) | ORAL | 0 refills | Status: DC | PRN

## 2015-12-23 MED ORDER — GABAPENTIN 300 MG PO CAPS: ORAL_CAPSULE | ORAL | 3 refills | Status: DC

## 2015-12-23 NOTE — Patient Instructions (Signed)
Thank you for coming in today. Get the MRI this week,.  START gabapentin and prednisone.  Use NORCO sparingly.  Attend PT.  Follow up with me in the new year.   Come back or go to the emergency room if you notice new weakness new numbness problems walking or bowel or bladder problems.

## 2015-12-23 NOTE — Progress Notes (Signed)
Vincent NatterKevin Wall is a 55 y.o. male who [facility] today for hip and back pain. Patient was seen last week for severe low back pain and anterior groin pain radiating to the anterior thigh.  He was given an ultrasound-guided left femoral acetabular diagnostic and therapeutic injection. The injection helped for only one day. He notes continued severe back pain radiating to the anterior thigh. He notes he continued to have back pain following a hip injection. The patient has worsened slightly. The pain interferes with sleep. He takes Norco which only helps a little. No bowel bladder dysfunction. No new weakness or numbness.   Past Medical History:  Diagnosis Date  . Heart attack   . Heart disease   . Hyperlipidemia   . Hypertension    Past Surgical History:  Procedure Laterality Date  . CORONARY STENT PLACEMENT  05/2010  . ELBOW SURGERY  2010   Social History  Substance Use Topics  . Smoking status: Current Every Day Smoker    Packs/day: 1.00    Years: 20.00    Types: Cigarettes  . Smokeless tobacco: Not on file  . Alcohol use 0.5 oz/week    1 Standard drinks or equivalent per week     ROS:  As above   Medications: Current Outpatient Prescriptions  Medication Sig Dispense Refill  . albuterol (PROVENTIL HFA;VENTOLIN HFA) 108 (90 BASE) MCG/ACT inhaler Inhale 2 puffs into the lungs every 6 (six) hours as needed for wheezing or shortness of breath. 1 Inhaler 3  . aspirin 81 MG tablet Take 81 mg by mouth daily.    Marland Kitchen. HYDROcodone-acetaminophen (NORCO) 10-325 MG tablet Take 0.5-1 tablets by mouth every 8 (eight) hours as needed for moderate pain. 60 tablet 0  . lisinopril-hydrochlorothiazide (PRINZIDE,ZESTORETIC) 20-12.5 MG tablet Take 1 tablet by mouth daily. 90 tablet 2  . metoprolol succinate (TOPROL-XL) 50 MG 24 hr tablet TAKE 1 TABLET (50 MG TOTAL) BY MOUTH DAILY. TAKE WITH OR IMMEDIATELY FOLLOWING A MEAL. 90 tablet 2  .  nitroGLYCERIN (NITROSTAT) 0.4 MG SL tablet Place 1 tablet (0.4 mg total) under the tongue every 5 (five) minutes as needed for chest pain. 50 tablet 3  . omeprazole (PRILOSEC) 20 MG capsule Take 20 mg by mouth daily.    . Pitavastatin Calcium (LIVALO) 2 MG TABS One by mouth daily. 90 tablet 1  . prasugrel (EFFIENT) 10 MG TABS tablet Take 1 tablet (10 mg total) by mouth daily. 90 tablet 3  . temazepam (RESTORIL) 30 MG capsule Take 1 capsule (30 mg total) by mouth at bedtime as needed for sleep. 30 capsule 4  . gabapentin (NEURONTIN) 300 MG capsule One tab PO qHS for a week, then BID for a week, then TID. May double weekly to a max of 3,600mg /day 180 capsule 3  . predniSONE (STERAPRED UNI-PAK 48 TAB) 10 MG (48) TBPK tablet Take by mouth daily. 12 day dosepack po 48 tablet 0   No current facility-administered medications for this visit.    Allergies  Allergen Reactions  . Crestor [Rosuvastatin]     Myalgia    . Lipitor [Atorvastatin]     Myalgias  . Statins     Joint pain; pt states his body "locked up"     Exam:  BP 130/75   Pulse 97  General: Well Developed, well nourished, and in no acute distress.  Neuro/Psych: Alert and oriented x3, extra-ocular muscles intact, able to move all 4 extremities, sensation grossly intact. Skin:  Warm and dry, no rashes noted.  Respiratory: Not using accessory muscles, speaking in full sentences, trachea midline.  Cardiovascular: Pulses palpable, no extremity edema. Abdomen: Does not appear distended. MSK: Nontender L-spine. Tender to palpation left lumbar paraspinal group. Pain with left hip motion. Antalgic gait. She we strength is intact.    No results found for this or any previous visit (from the past 48 hour(s)). No results found.    Assessment and Plan: 55 y.o. male with worsening lumbago with possible left L2 or 3 radiculopathy. This may be coexisting with hip pain as well. Plan for lumbar spine MRI to evaluate radiculopathy and cause of  pain. Additionally we'll treat with Norco gabapentin and prednisone Dosepak. Recheck in a few weeks.    Orders Placed This Encounter  Procedures  . MR Lumbar Spine Wo Contrast    Standing Status:   Future    Standing Expiration Date:   02/22/2017    Order Specific Question:   Reason for Exam (SYMPTOM  OR DIAGNOSIS REQUIRED)    Answer:   left lumbago with pain radiating to left anterior thigh.    Order Specific Question:   [facility]?    [facility]    Order Specific Question:   What is the patient's sedation requirement?    Answer:   No Sedation    Order Specific Question:   Does the patient have a pacemaker or implanted devices?    Answer:   No    Discussed warning signs or symptoms. Please see discharge instructions. Patient expresses understanding.

## 2015-12-25 ENCOUNTER — Telehealth: Payer: Self-pay | Admitting: Family Medicine

## 2015-12-25 ENCOUNTER — Ambulatory Visit (HOSPITAL_BASED_OUTPATIENT_CLINIC_OR_DEPARTMENT_OTHER)
Admission: RE | Admit: 2015-12-25 | Discharge: 2015-12-25 | Disposition: A | Discharge status: Home / Self Care | Payer: BLUE CROSS/BLUE SHIELD | Source: Ambulatory Visit | Attending: Family Medicine | Admitting: Family Medicine

## 2015-12-25 DIAGNOSIS — M545 Low back pain: Secondary | ICD-10-CM | POA: Insufficient documentation

## 2015-12-25 DIAGNOSIS — M5126 Other intervertebral disc displacement, lumbar region: Secondary | ICD-10-CM | POA: Diagnosis not present

## 2015-12-25 DIAGNOSIS — M5442 Lumbago with sciatica, left side: Secondary | ICD-10-CM

## 2015-12-25 DIAGNOSIS — M1288 Other specific arthropathies, not elsewhere classified, other specified site: Secondary | ICD-10-CM | POA: Insufficient documentation

## 2015-12-25 DIAGNOSIS — M5127 Other intervertebral disc displacement, lumbosacral region: Secondary | ICD-10-CM | POA: Diagnosis not present

## 2015-12-25 DIAGNOSIS — M5416 Radiculopathy, lumbar region: Secondary | ICD-10-CM | POA: Insufficient documentation

## 2015-12-25 DIAGNOSIS — M4807 Spinal stenosis, lumbosacral region: Secondary | ICD-10-CM | POA: Diagnosis not present

## 2015-12-25 DIAGNOSIS — M25859 Other specified joint disorders, unspecified hip: Secondary | ICD-10-CM

## 2015-12-25 DIAGNOSIS — M25552 Pain in left hip: Secondary | ICD-10-CM

## 2015-12-25 NOTE — Telephone Encounter (Addendum)
Pt advised, verbalized understanding. Pt does report the new medication,gabapentin, is helping him a lot. States he is able to sleep now. He is very satisfied. Pt is OK to get epidural injection.   Imaging notified about epidural order, they will contact Pt to schedule.

## 2015-12-25 NOTE — Telephone Encounter (Signed)
MRI shows multiple bulging disks. The worst one involves the left L3 nerve root which is exactly where you are having pain. I have ordered an epidural steroid injection to help relieve the pain. You should be contacted by range per radiology soon to schedule this

## 2016-01-01 ENCOUNTER — Ambulatory Visit (INDEPENDENT_AMBULATORY_CARE_PROVIDER_SITE_OTHER): Payer: BLUE CROSS/BLUE SHIELD | Admitting: Rehabilitative and Restorative Service Providers"

## 2016-01-01 ENCOUNTER — Encounter: Payer: Self-pay | Admitting: Rehabilitative and Restorative Service Providers"

## 2016-01-01 DIAGNOSIS — R29898 Other symptoms and signs involving the musculoskeletal system: Secondary | ICD-10-CM

## 2016-01-01 DIAGNOSIS — M6281 Muscle weakness (generalized): Secondary | ICD-10-CM | POA: Diagnosis not present

## 2016-01-01 DIAGNOSIS — M5416 Radiculopathy, lumbar region: Secondary | ICD-10-CM | POA: Diagnosis not present

## 2016-01-01 NOTE — Patient Instructions (Signed)
Abdominal Bracing With Pelvic Floor (Hook-Lying)    With neutral spine, tighten pelvic floor and abdominals sucking belly button to back bone; tighten the muscles in the low back at the waist. Hold 10 sec Repeat __10_ times. Do _several__ times a day. Progress to do this sitting standing and with functional activities    Sleeping on Back  Place pillow under knees. A pillow with cervical support and a roll around waist are also helpful. Copyright  VHI. All rights reserved.  Sleeping on Side Place pillow between knees. Use cervical support under neck and a roll around waist as needed. Copyright  VHI. All rights reserved.   Sleeping on Stomach   If this is the only desirable sleeping position, place pillow under lower legs, and under stomach or chest as needed.  Posture - Sitting   Sit upright, head facing forward. Try using a roll to support lower back. Keep shoulders relaxed, and avoid rounded back. Keep hips level with knees. Avoid crossing legs for long periods. Stand to Sit / Sit to Stand   To sit: Bend knees to lower self onto front edge of chair, then scoot back on seat. To stand: Reverse sequence by placing one foot forward, and scoot to front of seat. Use rocking motion to stand up.   Work Height and Reach  Ideal work height is no more than 2 to 4 inches below elbow level when standing, and at elbow level when sitting. Reaching should be limited to arm's length, with elbows slightly bent.  Bending  Bend at hips and knees, not back. Keep feet shoulder-width apart.    Posture - Standing   Good posture is important. Avoid slouching and forward head thrust. Maintain curve in low back and align ears over shoul- ders, hips over ankles.  Alternating Positions   Alternate tasks and change positions frequently to reduce fatigue and muscle tension. Take rest breaks. Computer Work   Position work to Art gallery managerface forward. Use proper work and seat height. Keep shoulders back and  down, wrists straight, and elbows at right angles. Use chair that provides full back support. Add footrest and lumbar roll as needed.  Getting Into / Out of Car  Lower self onto seat, scoot back, then bring in one leg at a time. Reverse sequence to get out.  Dressing  Lie on back to pull socks or slacks over feet, or sit and bend leg while keeping back straight.    Housework - Sink  Place one foot on ledge of cabinet under sink when standing at sink for prolonged periods.   Pushing / Pulling  Pushing is preferable to pulling. Keep back in proper alignment, and use leg muscles to do the work.  Deep Squat   Squat and lift with both arms held against upper trunk. Tighten stomach muscles without holding breath. Use smooth movements to avoid jerking.  Avoid Twisting   Avoid twisting or bending back. Pivot around using foot movements, and bend at knees if needed when reaching for articles.  Carrying Luggage   Distribute weight evenly on both sides. Use a cart whenever possible. Do not twist trunk. Move body as a unit.   Lifting Principles .Maintain proper posture and head alignment. .Slide object as close as possible before lifting. .Move obstacles out of the way. .Test before lifting; ask for help if too heavy. .Tighten stomach muscles without holding breath. .Use smooth movements; do not jerk. .Use legs to do the work, and pivot with feet. .Distribute the work  load symmetrically and close to the center of trunk. .Push instead of pull whenever possible.   Ask For Help   Ask for help and delegate to others when possible. Coordinate your movements when lifting together, and maintain the low back curve.  Log Roll   Lying on back, bend left knee and place left arm across chest. Roll all in one movement to the right. Reverse to roll to the left. Always move as one unit. Housework - Sweeping  Use long-handled equipment to avoid stooping.   Housework -  Wiping  Position yourself as close as possible to reach work surface. Avoid straining your back.  Laundry - Unloading Wash   To unload small items at bottom of washer, lift leg opposite to arm being used to reach.  Gardening - Raking  Move close to area to be raked. Use arm movements to do the work. Keep back straight and avoid twisting.     Cart  When reaching into cart with one arm, lift opposite leg to keep back straight.   Getting Into / Out of Bed  Lower self to lie down on one side by raising legs and lowering head at the same time. Use arms to assist moving without twisting. Bend both knees to roll onto back if desired. To sit up, start from lying on side, and use same move-ments in reverse. Housework - Vacuuming  Hold the vacuum with arm held at side. Step back and forth to move it, keeping head up. Avoid twisting.   Laundry - Armed forces training and education officerLoading Wash  Position laundry basket so that bending and twisting can be avoided.   Laundry - Unloading Dryer  Squat down to reach into clothes dryer or use a reacher.  Gardening - Weeding / Psychiatric nurselanting  Squat or Kneel. Knee pads may be helpful.                   TENS UNIT: This is helpful for muscle pain and spasm.   Search and Purchase a TENS 7000 2nd edition at www.tenspros.com. It should be less than $30.     TENS unit instructions: Do not shower or bathe with the unit on Turn the unit off before removing electrodes or batteries If the electrodes lose stickiness add a drop of water to the electrodes after they are disconnected from the unit and place on plastic sheet. If you continued to have difficulty, call the TENS unit company to purchase more electrodes. Do not apply lotion on the skin area prior to use. Make sure the skin is clean and dry as this will help prolong the life of the electrodes. After use, always check skin for unusual red areas, rash or other skin difficulties. If there are any skin problems, does  not apply electrodes to the same area. Never remove the electrodes from the unit by pulling the wires. Do not use the TENS unit or electrodes other than as directed. Do not change electrode placement without consultating your therapist or physician. Keep 2 fingers with between each electrode.

## 2016-01-01 NOTE — Therapy (Addendum)
[place], [place], [address] Phone: [phone]   Fax:  [phone]  Physical Therapy Evaluation  Patient Details  Name: Mouhamed Glassco MRN: [id number] Date of Birth: [birth year] Referring Provider: Dr Lynne Leader   Encounter Date: 01/01/2016      PT End of Session - 01/01/16 1758    Visit Number 1   Number of Visits 12   Date for PT Re-Evaluation 02/12/16   PT Start Time 2725   PT Stop Time 1800   PT Time Calculation (min) 55 min   Activity Tolerance Patient tolerated treatment well      Past Medical History:  Diagnosis Date  . Heart attack   . Heart disease   . Hyperlipidemia   . Hypertension     Past Surgical History:  Procedure Laterality Date  . CORONARY STENT PLACEMENT  05/2010  . ELBOW SURGERY  2010    There were no vitals filed for this visit.       Subjective Assessment - 01/01/16 1711    Subjective patient reports that he had sudden onset of Lt LBP 11/29/15. He got out of his truck and took two steps and fell. He has had pain in the Lt hip; groin; and LE areas. He had an injectioin which helped temporarily. MRI shows multiple ruptured lumbar discs. He is now on a new medication that does help with the pain. Remembers that he fell out of a chair the first of Novemeber and landed on the Lt hip when he was hunting. Also relates that he has been traveling and caring for his son who has cancer and has been treated at [facility]. Patient has been more sedentary and spent time sitting as well as sleeping in dirrerent beds    Pertinent History History of HNP Lumbar spine - treated with traction and medicine - resolved in about 6 months. He was scheduled for surgery but elected not to proceed with the surgery. 1985 or 1986. He has had intermittent LBP including "catches' which may last from a few minutes to a few hours. Episodes have become closer together and more severe. He has modified activity  level to prevent recurrent problems. CAD - stents x 2 ~ 5-6 yeras ago   How long can you sit comfortably? several hours if he is in his truck   How long can you stand comfortably? not at all    How long can you walk comfortably? 5-10 min    Diagnostic tests MRI - HNP lumbar spine (see report in chart)    Patient Stated Goals get rid of the pain    Currently in Pain? Yes   Pain Score 0-No pain  with medication    Pain Location Back   Pain Orientation Left   Pain Descriptors / Indicators Sharp   Pain Type Acute pain   Pain Radiating Towards into Lt groin and anterior thigh    Pain Onset More than a month ago   Pain Frequency Constant   Aggravating Factors  bending forward; activities of daily living   Pain Relieving Factors sitting in his truck; medicine            [facility] PT Assessment - 01/01/16 0001      Assessment   Medical Diagnosis Lt lumbar HNP    Referring Provider Dr Lynne Leader    Onset Date/Surgical Date 11/29/15   Hand Dominance Right   Next MD Visit 01/06/16   Prior Therapy none for  current diagnosis      Precautions   Precautions None     Balance Screen   Has the patient fallen in the past 6 months Yes   How many times? 1   Has the patient had a decrease in activity level because of a fear of falling?  No   Is the patient reluctant to leave their home because of a fear of falling?  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    Leisure hunting; golfing; yard work; tractors; has 3-4 acres; Dealer work - otherwise sedentary at home      Observation/Other Assessments   Focus on Therapeutic Outcomes (FOTO)  68% limitation      Sensation   Additional Comments WFL's per pt report      Posture/Postural Control   Posture Comments head forward; shoudlers rounded; incresed thoracic kyphosis;  weight shifted to Rt in standing      AROM   Lumbar Flexion 55%   Lumbar Extension 20%   Lumbar - Right Side Bend 55%   Lumbar - Left Side Bend 50%   Lumbar - Right Rotation 20%   Lumbar - Left Rotation 25%     Strength   Overall Strength Comments Rt LE 5/5 - Lt knee ext 4+/5; unable to test Lt hip due to pain      Flexibility   Hamstrings Rt 75 deg; Lt 65 deg    ITB tight Lt    Piriformis tight Lt      Palpation   Spinal mobility pain w/CPA mobs L3/4/5 and Lt UPA L3/4/5    Palpation comment tightness noted through the Lt lumbar paraspinals; QL; Lr hip flexors                    [facility] Adult PT Treatment/Exercise - 01/01/16 0001      Therapeutic Activites    Therapeutic Activities --  myofacial release w/ball Lt hip      Moist Heat Therapy   Number Minutes Moist Heat 15 Minutes   Moist Heat Location Lumbar Spine;Hip  Lt     Acupuncturist Location bilat L4 Lt hip    Electrical Stimulation Action IFC   Electrical Stimulation Parameters to toleracne   Electrical Stimulation Goals Pain;Tone                PT Education - 01/01/16 1758    Education provided Yes   Education Details back care; TENS; HEP    Person(s) Educated Patient   Methods Explanation;Demonstration;Tactile cues;Verbal cues;Handout   Comprehension Verbalized understanding;Returned demonstration;Verbal cues required;Tactile cues required             PT Long Term Goals - 01/01/16 1812      PT LONG TERM GOAL #1   Title Improve lumbar mobility to 65-75% of range and LE mobility to hamstring flexibility to 75-80 degrees 02/12/16   Time 6   Period Weeks   Status New     PT LONG TERM GOAL #2   Title Increase strength Lt LE to 4+/5 to 5/5 02/12/16   Time 6   Period Weeks   Status New     PT LONG TERM GOAL #3   Title Increase standing and walking tolerance to allow patient to perform funcitonal activities  required for work 02/12/16   Time 6    Period Weeks   Status New     PT LONG TERM GOAL #4   Title Independent in HEP 02/12/16   Time 6   Period Weeks   Status New     PT LONG TERM GOAL #5   Title Improve FOTO to </= 46% limitation 2//8/18   Time 6   Period Weeks   Status New               Plan - 01/01/16 1806    Clinical Impression Statement Patient presents with Lt lumbar radiculopathy which started 11/29/15. Nycere has a history of recurrent LBP over the course of the past 30+ years. He currently has pain into the Lt groin and and Lt anterior thigh. He has limited trunk and LE ROM; inability to assess strength Lt LE due to pain; limited functional activities including standing and walking.  Patient would like to wait to schedule appointments until after he sees Dr Georgina Snell 01/06/16.    Rehab Potential Good   PT Frequency 2x / week   PT Duration 6 weeks   PT Treatment/Interventions Patient/family education;ADLs/Self Care Home Management;Cryotherapy;Electrical Stimulation;Iontophoresis 53m/ml Dexamethasone;Moist Heat;Traction;Ultrasound;Dry needling;Manual techniques;Therapeutic activities;Therapeutic exercise   PT Next Visit Plan consider trial of lumbar traction; progress with core stabilization; LE stretching; spine care education; manual work and modalities as indicated   Consulted and Agree with Plan of Care Patient      Patient will benefit from skilled therapeutic intervention in order to improve the following deficits and impairments:  Improper body mechanics, Pain, Decreased range of motion, Decreased strength, Decreased mobility, Increased muscle spasms, Decreased endurance, Decreased activity tolerance  Visit Diagnosis: Lumbar radiculopathy - Plan: PT plan of care cert/re-cert  Muscle weakness (generalized) - Plan: PT plan of care cert/re-cert  Other symptoms and signs involving the musculoskeletal system - Plan: PT plan of care cert/re-cert     Problem List Patient Active Problem List   Diagnosis Date  Noted  . Lumbar radiculopathy 12/25/2015  . Lumbago 12/17/2015  . Left hip pain 12/17/2015  . Arteriosclerosis of abdominal aorta (HYuma 12/17/2015  . Plantar fasciitis of right foot 09/03/2014  . Insomnia 07/16/2014  . Prediabetes 10/16/2013  . History of colonic polyps 05/29/2013  . History of nephrolithiasis 05/29/2013  . Essential hypertension, benign 04/13/2013  . CAD (coronary artery disease) 04/13/2013  . History of MI (myocardial infarction) 04/13/2013  . Hyperlipidemia 04/13/2013  . Femoral acetabular impingement 04/13/2013    Deandria Klute PNilda SimmerPT, MPH  01/01/2016, 6:22 PM  [facility] [place] [address] [address]Phone: [phone]  Fax:  [phone] Name: KBroderick FonsecaMRN: [id number] of Birth: [birth year] PHYSICAL THERAPY DISCHARGE SUMMARY  Visits from Start of Care: Eval only   Current functional level related to goals / functional outcomes: See initial eval note for status    Remaining deficits: Unchanged    Education / Equipment: Initial HEP Plan: Patient agrees to discharge.  Patient goals were not met. Patient is being discharged due to not returning since the last visit.  ?????     Chitara Clonch P. HHelene KelpPT, MPH 02/04/16 2:18 PM

## 2016-01-05 DIAGNOSIS — I2510 Atherosclerotic heart disease of native coronary artery without angina pectoris: Secondary | ICD-10-CM | POA: Diagnosis not present

## 2016-01-05 DIAGNOSIS — M25559 Pain in unspecified hip: Secondary | ICD-10-CM | POA: Diagnosis not present

## 2016-01-05 DIAGNOSIS — R4182 Altered mental status, unspecified: Secondary | ICD-10-CM | POA: Diagnosis not present

## 2016-01-05 DIAGNOSIS — G4730 Sleep apnea, unspecified: Secondary | ICD-10-CM | POA: Diagnosis not present

## 2016-01-05 DIAGNOSIS — M25551 Pain in right hip: Secondary | ICD-10-CM | POA: Diagnosis not present

## 2016-01-05 DIAGNOSIS — Z720 Tobacco use: Secondary | ICD-10-CM | POA: Diagnosis not present

## 2016-01-05 DIAGNOSIS — M25552 Pain in left hip: Secondary | ICD-10-CM | POA: Diagnosis not present

## 2016-01-05 DIAGNOSIS — R55 Syncope and collapse: Secondary | ICD-10-CM | POA: Diagnosis not present

## 2016-01-05 DIAGNOSIS — J449 Chronic obstructive pulmonary disease, unspecified: Secondary | ICD-10-CM | POA: Diagnosis not present

## 2016-01-05 DIAGNOSIS — R9431 Abnormal electrocardiogram [ECG] [EKG]: Secondary | ICD-10-CM | POA: Diagnosis not present

## 2016-01-05 DIAGNOSIS — I10 Essential (primary) hypertension: Secondary | ICD-10-CM | POA: Diagnosis not present

## 2016-01-06 ENCOUNTER — Encounter: Payer: Self-pay | Admitting: Family Medicine

## 2016-01-06 ENCOUNTER — Ambulatory Visit (INDEPENDENT_AMBULATORY_CARE_PROVIDER_SITE_OTHER): Payer: BLUE CROSS/BLUE SHIELD | Admitting: Family Medicine

## 2016-01-06 VITALS — BP 122/64 | HR 87 | Wt 205.0 lb

## 2016-01-06 DIAGNOSIS — M5442 Lumbago with sciatica, left side: Secondary | ICD-10-CM

## 2016-01-06 DIAGNOSIS — I700 Atherosclerosis of aorta: Secondary | ICD-10-CM | POA: Diagnosis not present

## 2016-01-06 DIAGNOSIS — R55 Syncope and collapse: Secondary | ICD-10-CM | POA: Diagnosis not present

## 2016-01-06 DIAGNOSIS — I2510 Atherosclerotic heart disease of native coronary artery without angina pectoris: Secondary | ICD-10-CM

## 2016-01-06 MED ORDER — ALBUTEROL SULFATE HFA 108 (90 BASE) MCG/ACT IN AERS: 2.0000 | INHALATION_SPRAY | Freq: Four times a day (QID) | RESPIRATORY_TRACT | 3 refills | Status: DC | PRN

## 2016-01-06 NOTE — Progress Notes (Signed)
Vincent Wall is a 56 y.o. male who [facility] today for follow-up back pain and discuss syncope.  Back pain: Patient continues to experience lumbar radiculopathy. He notes this is significantly improved with gabapentin. He had a lumbar MRI since the last visit showing multiple disc bulging worse at left L3. He was unable to schedule his epidural steroid injection because he cannot identify when he should stop his Effient and aspirin. No new or worsening lumbar radiculopathy bowel bladder dysfunction or weakness.  Syncope: Patient had a syncopal event 2 days ago. [place] [facility]. He was in the bathroom in a hotel when he had a syncopal event. He's not sure what happened but does not think he hit his head. EMS was called and he was sent to the hospital where he had some unknown workup performed. His wife describes EKGs and lab work. He also had a head CT scan. He however left AGAINST MEDICAL ADVICE and does not have medical records. In the interval he is feeling quite well with no chest pain palpitations or subsequent syncope.  Cardiac history: Patient is a history of coronary artery disease with coronary stent placement about 6 years ago. [facility]. He has not had follow up since. He denies any chest pain, or palpitations    Past Medical History:  Diagnosis Date  . Heart attack   . Heart disease   . Hyperlipidemia   . Hypertension    Past Surgical History:  Procedure Laterality Date  . CORONARY STENT PLACEMENT  05/2010  . ELBOW SURGERY  2010   Social History  Substance Use Topics  . Smoking status: Current Every Day Smoker    Packs/day: 1.00    Years: 20.00    Types: Cigarettes  . Smokeless tobacco: Not on file  . Alcohol use 0.5 oz/week    1 Standard drinks or equivalent  per week   family history is not on file.  ROS as above:  Medications: Current Outpatient Prescriptions  Medication Sig Dispense Refill  . albuterol (PROVENTIL HFA;VENTOLIN HFA) 108 (90 Base) MCG/ACT inhaler Inhale 2 puffs into the lungs every 6 (six) hours as needed for wheezing or shortness of breath. 1 Inhaler 3  . aspirin 81 MG tablet Take 81 mg by mouth daily.    Marland Kitchen. gabapentin (NEURONTIN) 300 MG capsule One tab PO qHS for a week, then BID for a week, then TID. May double weekly to a max of 3,600mg /day 180 capsule 3  . HYDROcodone-acetaminophen (NORCO) 10-325 MG tablet Take 0.5-1 tablets by mouth every 8 (eight) hours as needed for moderate pain. 60 tablet 0  . lisinopril-hydrochlorothiazide (PRINZIDE,ZESTORETIC) 20-12.5 MG tablet Take 1 tablet by mouth daily. 90 tablet 2  . metoprolol succinate (TOPROL-XL) 50 MG 24 hr tablet TAKE 1 TABLET (50 MG TOTAL) BY MOUTH DAILY. TAKE WITH OR IMMEDIATELY FOLLOWING A MEAL. 90 tablet 2  . nitroGLYCERIN (NITROSTAT) 0.4 MG SL tablet Place 1 tablet (0.4 mg total) under the tongue every 5 (five) minutes as needed for chest pain. 50 tablet 3  . omeprazole (PRILOSEC) 20 MG capsule Take 20 mg by mouth daily.    . Pitavastatin Calcium (LIVALO) 2 MG TABS One by mouth daily. 90 tablet 1  . prasugrel (EFFIENT) 10 MG TABS tablet Take 1 tablet (10 mg total) by mouth daily. 90 tablet 3  . temazepam (RESTORIL)  30 MG capsule Take 1 capsule (30 mg total) by mouth at bedtime as needed for sleep. 30 capsule 4   No current facility-administered medications for this visit.    Allergies  Allergen Reactions  . Crestor [Rosuvastatin]     Myalgia    . Lipitor [Atorvastatin]     Myalgias  . Statins     Joint pain; pt states his body "locked up"    Health Maintenance Health Maintenance  Topic Date Due  . HIV Screening  09/02/2018 (Originally 01/05/1975)  . INFLUENZA VACCINE  09/05/2018 (Originally 08/05/2015)  . COLONOSCOPY  01/05/2023 (Originally 01/06/2008)  .  TETANUS/TDAP  12/06/2018  . Hepatitis C Screening  Completed     Exam:  BP 122/64   Pulse 87   Wt 205 lb (93 kg)   BMI 31.17 kg/m  Gen: Well NAD HEENT: EOMI,  MMM Lungs: Normal work of breathing. CTABL Heart: RRR no MRG Abd: NABS, Soft. Nondistended, Nontender Exts: Brisk capillary refill, warm and well perfused.   12 lead EKG: NSR at 95 beats per minute with no ST elevation or depression. No q waves. Normal EKG   No results found for this or any previous visit (from the past 72 hour(s)). No results found.    Assessment and Plan: 56 y.o. male with  1) Back Pain with lumbar radiculopathy. Likely Left L3 based on symptoms and MRI findings. Will re set up the epidural steroid injection. Plan to hold effient and aspirin starting now and for 3 days after the injection.   2) Syncope: Unclear etiology. Will get records from the outside hospital. The EKG is reassuring today. We will also refer back to his original cardiologist for further work up and evaluation. I feel it is reasonable safe to hold effient and aspirin for a week or two based on normal EKG and likely reassuring workup in the ER 2 days ago.   3) CAD: Will refer back to cardiology for further workup and recheck.    Orders Placed This Encounter  Procedures  . EKG 12-Lead    Discussed warning signs or symptoms. Please see discharge instructions. Patient expresses understanding.

## 2016-01-06 NOTE — Patient Instructions (Signed)
Thank you for coming in today. Hold off the effient and aspirin until 3 days after your injection.  You should hear from both the back injection people and [facility] soon.  Please let me know if you do not hear anything.   Return in 2-4 weeks or sooner if needed.   Call or go to the emergency room if you get worse, have trouble breathing, have chest pains, or palpitations.  Come back or go to the emergency room if you notice new weakness new numbness problems walking or bowel or bladder problems.

## 2016-01-12 ENCOUNTER — Ambulatory Visit
Admission: RE | Admit: 2016-01-12 | Discharge: 2016-01-12 | Disposition: A | Discharge status: Home / Self Care | Payer: BLUE CROSS/BLUE SHIELD | Source: Ambulatory Visit | Attending: Family Medicine | Admitting: Family Medicine

## 2016-01-12 VITALS — BP 126/78 | HR 101

## 2016-01-12 DIAGNOSIS — M5416 Radiculopathy, lumbar region: Secondary | ICD-10-CM

## 2016-01-12 DIAGNOSIS — M5126 Other intervertebral disc displacement, lumbar region: Secondary | ICD-10-CM | POA: Diagnosis not present

## 2016-01-12 MED ORDER — METHYLPREDNISOLONE ACETATE 40 MG/ML INJ SUSP (RADIOLOG
120.0000 mg | Freq: Once | INTRAMUSCULAR | Status: AC
  Administered 2016-01-12: 120 mg via EPIDURAL

## 2016-01-12 MED ORDER — IOPAMIDOL (ISOVUE-M 200) INJECTION 41%
1.0000 mL | Freq: Once | INTRAMUSCULAR | Status: AC
  Administered 2016-01-12: 1 mL via EPIDURAL

## 2016-01-12 NOTE — Discharge Instructions (Signed)

## 2016-01-20 ENCOUNTER — Ambulatory Visit (INDEPENDENT_AMBULATORY_CARE_PROVIDER_SITE_OTHER): Payer: BLUE CROSS/BLUE SHIELD | Admitting: Family Medicine

## 2016-01-20 ENCOUNTER — Encounter: Payer: Self-pay | Admitting: Family Medicine

## 2016-01-20 VITALS — BP 106/70 | HR 81 | Wt 209.0 lb

## 2016-01-20 DIAGNOSIS — M25552 Pain in left hip: Secondary | ICD-10-CM

## 2016-01-20 DIAGNOSIS — M5442 Lumbago with sciatica, left side: Secondary | ICD-10-CM

## 2016-01-20 DIAGNOSIS — M25859 Other specified joint disorders, unspecified hip: Secondary | ICD-10-CM | POA: Diagnosis not present

## 2016-01-20 MED ORDER — GABAPENTIN 300 MG PO CAPS: ORAL_CAPSULE | ORAL | 3 refills | Status: DC

## 2016-01-20 NOTE — Patient Instructions (Addendum)
Thank you for coming in today. Continue gabapentin.  Let me know if you want another injection.   Come back or go to the emergency room if you notice new weakness new numbness problems walking or bowel or bladder problems.  Let me know about the Livalo.

## 2016-01-20 NOTE — Progress Notes (Signed)
Vincent Wall is a 56 y.o. male who [facility] today for the. Patient has been seen several times for left lumbar radiculopathy. He had an epidural steroid injection on January 8. He notes he is feeling a lot better with the injection and with gabapentin. He still has some pain but overall is feeling better. He no longer is using hydrocodone. He denies any weakness or numbness.   Past Medical History:  Diagnosis Date  . Heart attack   . Heart disease   . Hyperlipidemia   . Hypertension    Past Surgical History:  Procedure Laterality Date  . CORONARY STENT PLACEMENT  05/2010  . ELBOW SURGERY  2010   Social History  Substance Use Topics  . Smoking status: Current Every Day Smoker    Packs/day: 1.00    Years: 20.00    Types: Cigarettes  . Smokeless tobacco: Not on file  . Alcohol use 0.5 oz/week    1 Standard drinks or equivalent per week     ROS:  As above   Medications: Current Outpatient Prescriptions  Medication Sig Dispense Refill  . albuterol (PROVENTIL HFA;VENTOLIN HFA) 108 (90 Base) MCG/ACT inhaler Inhale 2 puffs into the lungs every 6 (six) hours as needed for wheezing or shortness of breath. 1 Inhaler 3  . aspirin 81 MG tablet Take 81 mg by mouth daily.    Marland Kitchen. gabapentin (NEURONTIN) 300 MG capsule One tab PO qHS for a week, then BID for a week, then TID. May double weekly to a max of 3,600mg /day 180 capsule 3  . HYDROcodone-acetaminophen (NORCO) 10-325 MG tablet Take 0.5-1 tablets by mouth every 8 (eight) hours as needed for moderate pain. 60 tablet 0  . lisinopril-hydrochlorothiazide (PRINZIDE,ZESTORETIC) 20-12.5 MG tablet Take 1 tablet by mouth daily. 90 tablet 2  . metoprolol succinate (TOPROL-XL) 50 MG 24 hr tablet TAKE 1 TABLET (50 MG TOTAL) BY MOUTH DAILY. TAKE WITH OR IMMEDIATELY FOLLOWING A MEAL. 90 tablet 2  . nitroGLYCERIN (NITROSTAT) 0.4 MG SL tablet Place 1 tablet (0.4 mg total) under the tongue every  5 (five) minutes as needed for chest pain. 50 tablet 3  . omeprazole (PRILOSEC) 20 MG capsule Take 20 mg by mouth daily.    . Pitavastatin Calcium (LIVALO) 2 MG TABS One by mouth daily. 90 tablet 1  . prasugrel (EFFIENT) 10 MG TABS tablet Take 1 tablet (10 mg total) by mouth daily. 90 tablet 3  . temazepam (RESTORIL) 30 MG capsule Take 1 capsule (30 mg total) by mouth at bedtime as needed for sleep. 30 capsule 4   No current facility-administered medications for this visit.    Allergies  Allergen Reactions  . Crestor [Rosuvastatin]     Myalgia    . Lipitor [Atorvastatin]     Myalgias  . Statins     Joint pain; pt states his body "locked up"     Exam:  BP 106/70   Pulse 81   Wt 209 lb (94.8 kg)   BMI 31.78 kg/m  General: Well Developed, well nourished, and in no acute distress.  Neuro/Psych: Alert and oriented x3, extra-ocular muscles intact, able to move all 4 extremities, sensation grossly intact. Skin: Warm and dry, no rashes noted.  Respiratory: Not using accessory muscles, speaking in full sentences, trachea midline.  Cardiovascular: Pulses palpable, no extremity edema. Abdomen: Does not appear distended. MSK: Normal L-spine motion. Normal gait. Lower extremity strength is intact.    No results found for  this or any previous visit (from the past 48 hour(s)). No results found.    Assessment and Plan: 56 y.o. male with lumbar radiculopathy improving. Continue gabapentin as needed. We'll repeat epidural steroid injection if symptoms are still present in a few weeks.    No orders of the defined types were placed in this encounter.   Discussed warning signs or symptoms. Please see discharge instructions. Patient expresses understanding.

## 2016-02-11 ENCOUNTER — Other Ambulatory Visit: Payer: Self-pay

## 2016-02-11 DIAGNOSIS — I2510 Atherosclerotic heart disease of native coronary artery without angina pectoris: Secondary | ICD-10-CM

## 2016-02-11 MED ORDER — PITAVASTATIN CALCIUM 2 MG PO TABS: ORAL_TABLET | ORAL | 1 refills | Status: DC

## 2016-02-13 DIAGNOSIS — R55 Syncope and collapse: Secondary | ICD-10-CM | POA: Diagnosis not present

## 2016-02-13 DIAGNOSIS — E7800 Pure hypercholesterolemia, unspecified: Secondary | ICD-10-CM | POA: Diagnosis not present

## 2016-02-13 DIAGNOSIS — Z955 Presence of coronary angioplasty implant and graft: Secondary | ICD-10-CM | POA: Diagnosis not present

## 2016-02-13 DIAGNOSIS — I10 Essential (primary) hypertension: Secondary | ICD-10-CM | POA: Diagnosis not present

## 2016-03-17 DIAGNOSIS — I517 Cardiomegaly: Secondary | ICD-10-CM | POA: Diagnosis not present

## 2016-03-17 DIAGNOSIS — I77810 Thoracic aortic ectasia: Secondary | ICD-10-CM | POA: Diagnosis not present

## 2016-03-24 ENCOUNTER — Other Ambulatory Visit: Payer: Self-pay | Admitting: Family Medicine

## 2016-03-25 MED ORDER — PRASUGREL HCL 10 MG PO TABS: 10.0000 mg | ORAL_TABLET | Freq: Every day | ORAL | 3 refills | Status: DC

## 2016-04-02 DIAGNOSIS — G4733 Obstructive sleep apnea (adult) (pediatric): Secondary | ICD-10-CM | POA: Diagnosis not present

## 2016-04-20 DIAGNOSIS — F17210 Nicotine dependence, cigarettes, uncomplicated: Secondary | ICD-10-CM | POA: Diagnosis not present

## 2016-04-20 DIAGNOSIS — R5383 Other fatigue: Secondary | ICD-10-CM | POA: Diagnosis not present

## 2016-04-20 DIAGNOSIS — G4733 Obstructive sleep apnea (adult) (pediatric): Secondary | ICD-10-CM | POA: Diagnosis not present

## 2016-04-20 DIAGNOSIS — G4719 Other hypersomnia: Secondary | ICD-10-CM | POA: Diagnosis not present

## 2016-04-20 DIAGNOSIS — I10 Essential (primary) hypertension: Secondary | ICD-10-CM | POA: Diagnosis not present

## 2016-04-26 DIAGNOSIS — G4733 Obstructive sleep apnea (adult) (pediatric): Secondary | ICD-10-CM | POA: Diagnosis not present

## 2016-05-03 ENCOUNTER — Encounter: Payer: Self-pay | Admitting: Family Medicine

## 2016-05-03 DIAGNOSIS — G4733 Obstructive sleep apnea (adult) (pediatric): Secondary | ICD-10-CM | POA: Insufficient documentation

## 2016-05-26 DIAGNOSIS — G4733 Obstructive sleep apnea (adult) (pediatric): Secondary | ICD-10-CM | POA: Diagnosis not present

## 2016-06-07 ENCOUNTER — Telehealth: Payer: Self-pay | Admitting: Family Medicine

## 2016-06-07 NOTE — Telephone Encounter (Signed)
Received PA for Livalo sent through cover my meds waiting on determination. - CF

## 2016-06-10 NOTE — Telephone Encounter (Signed)
livalo has been approved. Message left on pharmacy vm and patient vm

## 2016-06-21 ENCOUNTER — Ambulatory Visit (INDEPENDENT_AMBULATORY_CARE_PROVIDER_SITE_OTHER): Payer: BLUE CROSS/BLUE SHIELD | Admitting: Family Medicine

## 2016-06-21 ENCOUNTER — Encounter: Payer: Self-pay | Admitting: Family Medicine

## 2016-06-21 VITALS — BP 120/66 | HR 74 | Ht 68.0 in | Wt 219.0 lb

## 2016-06-21 DIAGNOSIS — R7303 Prediabetes: Secondary | ICD-10-CM

## 2016-06-21 DIAGNOSIS — Z125 Encounter for screening for malignant neoplasm of prostate: Secondary | ICD-10-CM

## 2016-06-21 DIAGNOSIS — M5442 Lumbago with sciatica, left side: Secondary | ICD-10-CM | POA: Diagnosis not present

## 2016-06-21 DIAGNOSIS — M5416 Radiculopathy, lumbar region: Secondary | ICD-10-CM | POA: Diagnosis not present

## 2016-06-21 DIAGNOSIS — I10 Essential (primary) hypertension: Secondary | ICD-10-CM

## 2016-06-21 DIAGNOSIS — M25552 Pain in left hip: Secondary | ICD-10-CM

## 2016-06-21 DIAGNOSIS — M25859 Other specified joint disorders, unspecified hip: Secondary | ICD-10-CM

## 2016-06-21 DIAGNOSIS — E782 Mixed hyperlipidemia: Secondary | ICD-10-CM

## 2016-06-21 DIAGNOSIS — I700 Atherosclerosis of aorta: Secondary | ICD-10-CM

## 2016-06-21 NOTE — Patient Instructions (Signed)
Thank you for coming in today. Work on those side leg raises.  Work on stretching.  Recheck in 6 months.   Return sooner if needed.    Get fasting labs in about 1 month.   Let me know when you are ready to quit smoking.  I would recommend Chantix again.

## 2016-06-21 NOTE — Progress Notes (Signed)
Vincent NatterKevin Wall is a 56 y.o. male who [facility] today for follow-up hypertension hyperlipidemia sleep apnea smoking hip and back pain.  Hypertension: Patient has hypertension attributable to sleep apnea and obesity. He takes lisinopril/hydrochlorothiazide and metoprolol daily. He feels well and denies any lightheadedness or dizziness chest pain or palpitations.  Hyperlipidemia: Patient has likely familial hyperlipidemia. He notes a strong family history for heart attacks and has a personal history for coronary artery disease with stent placement. He has tried multiple different statins in the past and had difficulty tolerating them. He has done very well on Livalo. However his insurance did not cover it until just recently. He has been on Livalo now for only about a week. He tolerates this medication extremely well. He of note has had a cardiac workup recently and had a normal echocardiogram. Results are pending.  Sleep apnea: In the interim patient was recently diagnosed with obstructive sleep apnea. He's been using a CPAP machine nightly and notes that it is extremely helpful. He feels somewhat better and is able to feel rested during the day.  Lumbar pain: Patient has a history of left L3 radiculopathy improved with gabapentin and lumbar epidural injection. He notes the pain has slightly returned although he is feeling better than he was initially. He is thinking he might be due for another injection in the near future.  Left lateral hip pain: Patient notes onset of left lateral hip pain over the past several months. Pain is located in the lateral hip worse with standing and rising from a seated position. He also has pain in that side when he lies on the left side.  Smoking: Patient has a history of smoking. He has done well with Chantix in the past but is not  currently ready to quit smoking.   Past Medical History:  Diagnosis Date  . Heart attack (HCC)   . Heart disease   . Hyperlipidemia   . Hypertension    Past Surgical History:  Procedure Laterality Date  . CORONARY STENT PLACEMENT  05/2010  . ELBOW SURGERY  2010   Social History  Substance Use Topics  . Smoking status: Current Every Day Smoker    Packs/day: 1.00    Years: 20.00    Types: Cigarettes  . Smokeless tobacco: Never Used  . Alcohol use 0.5 oz/week    1 Standard drinks or equivalent per week   family history is not on file.  ROS as above:  Medications: Current Outpatient Prescriptions  Medication Sig Dispense Refill  . albuterol (PROVENTIL HFA;VENTOLIN HFA) 108 (90 Base) MCG/ACT inhaler Inhale 2 puffs into the lungs every 6 (six) hours as needed for wheezing or shortness of breath. 1 Inhaler 3  . aspirin 81 MG tablet Take 81 mg by mouth daily.    Marland Kitchen. gabapentin (NEURONTIN) 300 MG capsule One tab PO qHS for a week, then BID for a week, then TID. May double weekly to a max of 3,600mg /day 180 capsule 3  . HYDROcodone-acetaminophen (NORCO) 10-325 MG tablet Take 0.5-1 tablets by mouth every 8 (eight) hours as needed for moderate pain. 60 tablet 0  . lisinopril-hydrochlorothiazide (PRINZIDE,ZESTORETIC) 20-12.5 MG tablet Take 1 tablet by mouth daily. 90 tablet 2  . metoprolol succinate (TOPROL-XL) 50 MG 24 hr tablet TAKE 1 TABLET (50 MG TOTAL) BY MOUTH DAILY. TAKE WITH OR IMMEDIATELY FOLLOWING A MEAL. 90 tablet 2  . nitroGLYCERIN (NITROSTAT) 0.4 MG SL tablet  Place 1 tablet (0.4 mg total) under the tongue every 5 (five) minutes as needed for chest pain. 50 tablet 3  . omeprazole (PRILOSEC) 20 MG capsule Take 20 mg by mouth daily.    . Pitavastatin Calcium (LIVALO) 2 MG TABS One by mouth daily. 90 tablet 1  . prasugrel (EFFIENT) 10 MG TABS tablet Take 1 tablet (10 mg total) by mouth daily. 90 tablet 3  . temazepam (RESTORIL) 30 MG capsule Take 1 capsule (30 mg total) by mouth  at bedtime as needed for sleep. 30 capsule 4   No current facility-administered medications for this visit.    Allergies  Allergen Reactions  . Crestor [Rosuvastatin]     Myalgia    . Lipitor [Atorvastatin]     Myalgias  . Statins     Joint pain; pt states his body "locked up"    Health Maintenance Health Maintenance  Topic Date Due  . HIV Screening  09/02/2018 (Originally 01/05/1975)  . INFLUENZA VACCINE  09/05/2018 (Originally 08/04/2016)  . COLONOSCOPY  01/05/2023 (Originally 01/06/2008)  . TETANUS/TDAP  12/06/2018  . Hepatitis C Screening  Completed     Exam:  BP 120/66   Pulse 74   Ht 5\' 8"  (1.727 m)   Wt 219 lb (99.3 kg)   BMI 33.30 kg/m  Gen: Well NAD HEENT: EOMI,  MMM Lungs: Normal work of breathing. CTABL Heart: RRR no MRG Abd: NABS, Soft. Nondistended, Nontender Exts: Brisk capillary refill, warm and well perfused.  Left hip: Decreased range of motion. Tender to palpation left lateral hip in the region of the tensor fascia lata onto the ileum. Significant weakness 4/5 to abduction strength. Resisted abduction produces pain. Mild antalgic gait present.  No results found for this or any previous visit (from the past 72 hour(s)). No results found.    Assessment and Plan: 56 y.o. male with  Hypertension. Blood pressure goal. Check basic fasting labs in about a month continue current medications.  Hyperlipidemia: Back on Livalo. We'll check fasting labs in about a month. Continue current medication.  Sleep apnea: Doing very well with CPAP. We'll get records.  Lumbar radiculopathy: Slight recurrence. Patient will let me know when he is ready for an injection.  Left lateral hip pain: Likely greater trochanter bursitis type. Work on home exercises I have provided.  Smoking: Patient is not ready to quit smoking.  Pre-diabetes: A1c will be checked with rest of labs.  Prostate cancer screening. PSA pending  Recheck in 6 months or sooner if  needed.   Orders Placed This Encounter  Procedures  . CBC  . COMPLETE METABOLIC PANEL WITH GFR  . Lipid Panel w/reflex Direct LDL  . Hemoglobin A1c  . PSA   No orders of the defined types were placed in this encounter.    Discussed warning signs or symptoms. Please see discharge instructions. Patient expresses understanding.  I spent 40 minutes with this patient, greater than 50% was face-to-face time counseling regarding the above diagnosis.

## 2016-06-24 ENCOUNTER — Other Ambulatory Visit: Payer: Self-pay | Admitting: Family Medicine

## 2016-06-26 DIAGNOSIS — G4733 Obstructive sleep apnea (adult) (pediatric): Secondary | ICD-10-CM | POA: Diagnosis not present

## 2016-06-29 ENCOUNTER — Other Ambulatory Visit: Payer: Self-pay | Admitting: Family Medicine

## 2016-07-05 ENCOUNTER — Telehealth: Payer: Self-pay

## 2016-07-05 NOTE — Telephone Encounter (Signed)
Received call from pt's NEW pharmacy stating that he will need a new script for temazepam sent to them he is NOT USING [facility] ANYMORE  . [facility] now uses [facility].  [facility]ed [facility] [place] [phone]) spoke w/Micheal asked him to release the rx for temazepam. Due to pt changing pharmacys.Heath GoldBarkley, Saamiya Jeppsen [place]   rx [facility]ed to [facility].Loralee PacasBarkley, Nasiya Pascual [place]

## 2016-07-05 NOTE — Telephone Encounter (Signed)
Pt stated that there was a problem with his insurance covering his Norco.  He said if you called it in he will just pay out of pocket.  Please advise.

## 2016-07-21 ENCOUNTER — Encounter: Payer: Self-pay | Admitting: Family Medicine

## 2016-07-21 ENCOUNTER — Ambulatory Visit (INDEPENDENT_AMBULATORY_CARE_PROVIDER_SITE_OTHER): Payer: BLUE CROSS/BLUE SHIELD | Admitting: Family Medicine

## 2016-07-21 VITALS — BP 146/76 | HR 84 | Ht 68.0 in | Wt 220.0 lb

## 2016-07-21 DIAGNOSIS — I10 Essential (primary) hypertension: Secondary | ICD-10-CM

## 2016-07-21 DIAGNOSIS — M25859 Other specified joint disorders, unspecified hip: Secondary | ICD-10-CM

## 2016-07-21 DIAGNOSIS — I2510 Atherosclerotic heart disease of native coronary artery without angina pectoris: Secondary | ICD-10-CM

## 2016-07-21 DIAGNOSIS — M5442 Lumbago with sciatica, left side: Secondary | ICD-10-CM | POA: Diagnosis not present

## 2016-07-21 DIAGNOSIS — M25552 Pain in left hip: Secondary | ICD-10-CM

## 2016-07-21 MED ORDER — OMEPRAZOLE 20 MG PO CPDR: 20.0000 mg | DELAYED_RELEASE_CAPSULE | Freq: Every day | ORAL | 3 refills | Status: DC

## 2016-07-21 MED ORDER — METOPROLOL SUCCINATE ER 50 MG PO TB24: ORAL_TABLET | ORAL | 3 refills | Status: DC

## 2016-07-21 MED ORDER — PRASUGREL HCL 10 MG PO TABS: 10.0000 mg | ORAL_TABLET | Freq: Every day | ORAL | 3 refills | Status: DC

## 2016-07-21 MED ORDER — LISINOPRIL-HYDROCHLOROTHIAZIDE 20-12.5 MG PO TABS: 1.0000 | ORAL_TABLET | Freq: Every day | ORAL | 3 refills | Status: DC

## 2016-07-21 MED ORDER — HYDROCODONE-ACETAMINOPHEN 10-325 MG PO TABS: 0.5000 | ORAL_TABLET | Freq: Three times a day (TID) | ORAL | 0 refills | Status: DC | PRN

## 2016-07-21 MED ORDER — PITAVASTATIN CALCIUM 2 MG PO TABS: ORAL_TABLET | ORAL | 3 refills | Status: DC

## 2016-07-21 NOTE — Patient Instructions (Signed)
Thank you for coming in today. Recheck in 6 months or sooner if needed.  We are OK to refill medicines.

## 2016-07-21 NOTE — Progress Notes (Signed)
Vincent Wall is a 56 y.o. male who presents to [facility] today for follow up back pain.  Vincent Wall has chronic back pain. He typically takes over-the-counter medications but will occasionally use Norco for pain control. This was last filled 6 months ago and he thinks he needs a refill. He denies significant radiating pain weakness or numbness fevers or chills.   Past Medical History:  Diagnosis Date  . Heart attack (HCC)   . Heart disease   . Hyperlipidemia   . Hypertension    Past Surgical History:  Procedure Laterality Date  . CORONARY STENT PLACEMENT  05/2010  . ELBOW SURGERY  2010   Social History  Substance Use Topics  . Smoking status: Current Every Day Smoker    Packs/day: 1.00    Years: 20.00    Types: Cigarettes  . Smokeless tobacco: Never Used  . Alcohol use 0.5 oz/week    1 Standard drinks or equivalent per week     ROS:  As above   Medications: Current Outpatient Prescriptions  Medication Sig Dispense Refill  . albuterol (PROVENTIL HFA;VENTOLIN HFA) 108 (90 Base) MCG/ACT inhaler Inhale 2 puffs into the lungs every 6 (six) hours as needed for wheezing or shortness of breath. 1 Inhaler 3  . aspirin 81 MG tablet Take 81 mg by mouth daily.    Marland Kitchen gabapentin (NEURONTIN) 300 MG capsule One tab PO qHS for a week, then BID for a week, then TID. May double weekly to a max of 3,600mg /day 180 capsule 3  . HYDROcodone-acetaminophen (NORCO) 10-325 MG tablet Take 0.5-1 tablets by mouth every 8 (eight) hours as needed for moderate pain. 60 tablet 0  . lisinopril-hydrochlorothiazide (PRINZIDE,ZESTORETIC) 20-12.5 MG tablet Take 1 tablet by mouth daily. 90 tablet 3  . metoprolol succinate (TOPROL-XL) 50 MG 24 hr tablet TAKE 1 TABLET (50 MG TOTAL) BY MOUTH DAILY. TAKE WITH OR IMMEDIATELY FOLLOWING A MEAL. 90 tablet 3  . nitroGLYCERIN (NITROSTAT) 0.4 MG SL tablet Place 1 tablet (0.4 mg total) under the tongue every 5 (five) minutes as needed  for chest pain. 50 tablet 3  . omeprazole (PRILOSEC) 20 MG capsule Take 1 capsule (20 mg total) by mouth daily. 90 capsule 3  . Pitavastatin Calcium (LIVALO) 2 MG TABS One by mouth daily. 90 tablet 3  . prasugrel (EFFIENT) 10 MG TABS tablet Take 1 tablet (10 mg total) by mouth daily. 90 tablet 3  . temazepam (RESTORIL) 30 MG capsule TAKE ONE CAPSULE BY MOUTH AT BEDTIME AS NEEDED FOR SLEEP 30 capsule 4   No current facility-administered medications for this visit.    Allergies  Allergen Reactions  . Crestor [Rosuvastatin]     Myalgia    . Lipitor [Atorvastatin]     Myalgias  . Statins     Joint pain; pt states his body "locked up"     Exam:  BP (!) 146/76   Pulse 84   Ht 5\' 8"  (1.727 m)   Wt 220 lb (99.8 kg)   BMI 33.45 kg/m  General: Well Developed, well nourished, and in no acute distress.  Neuro/Psych: Alert and oriented x3, extra-ocular muscles intact, able to move all 4 extremities, sensation grossly intact. Skin: Warm and dry, no rashes noted.  Respiratory: Not using accessory muscles, speaking in full sentences, trachea midline.  Cardiovascular: Pulses palpable, no extremity edema. Abdomen: Does not appear distended. MSK: L spine: Nontender to midline normal back motion. Mild antalgic gait.  Patient researched [place]  Controlled Substance Reporting System.   No results found for this or any previous visit (from the past 48 hour(s)). No results found.    Assessment and Plan: 56 y.o. male with chronic back pain. Plan to continue Norco. May refill in addition within 6 months if needed.  Additionally we'll continue to refill temazepam for insomnia as needed.  Recheck in 6 months. All medicines may be results and her dizziness as needed.    No orders of the defined types were placed in this encounter.  Meds ordered this encounter  Medications  . HYDROcodone-acetaminophen (NORCO) 10-325 MG tablet    Sig: Take 0.5-1 tablets by mouth every 8 (eight)  hours as needed for moderate pain.    Dispense:  60 tablet    Refill:  0  . lisinopril-hydrochlorothiazide (PRINZIDE,ZESTORETIC) 20-12.5 MG tablet    Sig: Take 1 tablet by mouth daily.    Dispense:  90 tablet    Refill:  3  . metoprolol succinate (TOPROL-XL) 50 MG 24 hr tablet    Sig: TAKE 1 TABLET (50 MG TOTAL) BY MOUTH DAILY. TAKE WITH OR IMMEDIATELY FOLLOWING A MEAL.    Dispense:  90 tablet    Refill:  3  . omeprazole (PRILOSEC) 20 MG capsule    Sig: Take 1 capsule (20 mg total) by mouth daily.    Dispense:  90 capsule    Refill:  3  . Pitavastatin Calcium (LIVALO) 2 MG TABS    Sig: One by mouth daily.    Dispense:  90 tablet    Refill:  3  . prasugrel (EFFIENT) 10 MG TABS tablet    Sig: Take 1 tablet (10 mg total) by mouth daily.    Dispense:  90 tablet    Refill:  3    Discussed warning signs or symptoms. Please see discharge instructions. Patient expresses understanding.

## 2016-07-26 DIAGNOSIS — G4733 Obstructive sleep apnea (adult) (pediatric): Secondary | ICD-10-CM | POA: Diagnosis not present

## 2016-08-31 ENCOUNTER — Other Ambulatory Visit: Payer: Self-pay | Admitting: Family Medicine

## 2016-08-31 DIAGNOSIS — M25552 Pain in left hip: Secondary | ICD-10-CM

## 2016-08-31 DIAGNOSIS — M25859 Other specified joint disorders, unspecified hip: Secondary | ICD-10-CM

## 2016-08-31 DIAGNOSIS — I2510 Atherosclerotic heart disease of native coronary artery without angina pectoris: Secondary | ICD-10-CM

## 2016-08-31 DIAGNOSIS — M5442 Lumbago with sciatica, left side: Secondary | ICD-10-CM

## 2016-09-03 ENCOUNTER — Ambulatory Visit (INDEPENDENT_AMBULATORY_CARE_PROVIDER_SITE_OTHER): Payer: BLUE CROSS/BLUE SHIELD | Admitting: Family Medicine

## 2016-09-03 VITALS — BP 122/75 | HR 75 | Wt 215.0 lb

## 2016-09-03 DIAGNOSIS — M722 Plantar fascial fibromatosis: Secondary | ICD-10-CM

## 2016-09-03 NOTE — Progress Notes (Signed)
Vincent NatterKevin Chalupa is a 56 y.o. male who [facility] today for right plantar fasciitis. Patient has a history of right-sided plantar fasciitis. He continues to use cushioned heels and exercises but occasionally will get a steroid injection. This is done well for a long time and he would like to have an injection today. Possible. He notes his symptoms are returning. He notes pain especially with ambulation. He is about to go on a golf trip and does not want to hurt.   Past Medical History:  Diagnosis Date  . Heart attack (HCC)   . Heart disease   . Hyperlipidemia   . Hypertension    Past Surgical History:  Procedure Laterality Date  . CORONARY STENT PLACEMENT  05/2010  . ELBOW SURGERY  2010   Social History  Substance Use Topics  . Smoking status: Current Every Day Smoker    Packs/day: 1.00    Years: 20.00    Types: Cigarettes  . Smokeless tobacco: Never Used  . Alcohol use 0.5 oz/week    1 Standard drinks or equivalent per week   family history includes Breast cancer in his unknown relative; Diabetes in his unknown relative; Heart attack in his unknown relative; Hyperlipidemia in his unknown relative; Hypertension in his unknown relative.  ROS as above:  Medications: Current Outpatient Prescriptions  Medication Sig Dispense Refill  . albuterol (PROVENTIL HFA;VENTOLIN HFA) 108 (90 Base) MCG/ACT inhaler Inhale 2 puffs into the lungs every 6 (six) hours as needed for wheezing or shortness of breath. 1 Inhaler 3  . aspirin 81 MG tablet Take 81 mg by mouth daily.    Marland Kitchen. gabapentin (NEURONTIN) 300 MG capsule ONE CAP AT BEDT FOR A WEEK, 2XDAY FOR A WEEK, 3XDAY, MAY DOUBLE WEEKLY TO A MAX OF 3,600MG /DAY 180 capsule 3  . HYDROcodone-acetaminophen (NORCO) 10-325 MG tablet Take 0.5-1 tablets by mouth every 8 (eight) hours as needed for moderate pain. 60 tablet 0  .  lisinopril-hydrochlorothiazide (PRINZIDE,ZESTORETIC) 20-12.5 MG tablet Take 1 tablet by mouth daily. 90 tablet 3  . LIVALO 2 MG TABS ONE BY MOUTH DAILY. 90 tablet 1  . metoprolol succinate (TOPROL-XL) 50 MG 24 hr tablet TAKE 1 TABLET (50 MG TOTAL) BY MOUTH DAILY. TAKE WITH OR IMMEDIATELY FOLLOWING A MEAL. 90 tablet 3  . nitroGLYCERIN (NITROSTAT) 0.4 MG SL tablet Place 1 tablet (0.4 mg total) under the tongue every 5 (five) minutes as needed for chest pain. 50 tablet 3  . omeprazole (PRILOSEC) 20 MG capsule Take 1 capsule (20 mg total) by mouth daily. 90 capsule 3  . prasugrel (EFFIENT) 10 MG TABS tablet Take 1 tablet (10 mg total) by mouth daily. 90 tablet 3  . temazepam (RESTORIL) 30 MG capsule TAKE ONE CAPSULE BY MOUTH AT BEDTIME AS NEEDED FOR SLEEP 30 capsule 4   No current facility-administered medications for this visit.    Allergies  Allergen Reactions  . Crestor [Rosuvastatin]     Myalgia    . Lipitor [Atorvastatin]     Myalgias  . Statins     Joint pain; pt states his body "locked up"    Health Maintenance Health Maintenance  Topic Date Due  . INFLUENZA VACCINE  09/03/2017 (Originally 08/04/2016)  . HIV Screening  09/02/2018 (Originally 01/05/1975)  . COLONOSCOPY  01/05/2023 (Originally 01/06/2008)  . TETANUS/TDAP  12/06/2018  . Hepatitis C Screening  Completed     Exam:  BP 122/75   Pulse  75   Wt 215 lb (97.5 kg)   SpO2 96%   BMI 32.69 kg/m  Gen: Well NAD  Right foot normal appearing normal motion tenderness palpation plantar calcaneus.  Procedure: Real-time Ultrasound Guided Injection of right PF  Device: GE Logiq E  Images permanently stored and available for review in the ultrasound unit. Verbal informed consent obtained. Discussed risks and benefits of procedure. Warned about infection bleeding damage to structures skin hypopigmentation and fat atrophy among others. Patient expresses understanding and agreement Time-out conducted.  Noted no overlying  erythema, induration, or other signs of local infection.  Skin prepped in a sterile fashion.  Local anesthesia: Topical Ethyl chloride.  With sterile technique and under real time ultrasound guidance: 40mg  kenalog and 2ml marcaine injected easily.  Completed without difficulty  Pain immediately resolved suggesting accurate placement of the medication.  Advised to call if fevers/chills, erythema, induration, drainage, or persistent bleeding.  Images permanently stored and available for review in the ultrasound unit.  Impression: Technically successful ultrasound guided injection.    No results found for this or any previous visit (from the past 72 hour(s)). No results found.    Assessment and Plan: 56 y.o. male with right plantar fasciitis status post injection today. Plan to continue home exercise program and recheck as needed.     No orders of the defined types were placed in this encounter.  No orders of the defined types were placed in this encounter.    Discussed warning signs or symptoms. Please see discharge instructions. Patient expresses understanding.

## 2016-09-03 NOTE — Patient Instructions (Signed)
Thank you for coming in today. Recheck as needed. Call or go to the ER if you develop a large red swollen joint with extreme pain or oozing puss.  Continue the exercises.

## 2016-09-09 ENCOUNTER — Other Ambulatory Visit: Payer: Self-pay | Admitting: Family Medicine

## 2016-09-09 DIAGNOSIS — I10 Essential (primary) hypertension: Secondary | ICD-10-CM

## 2016-09-15 DIAGNOSIS — G4733 Obstructive sleep apnea (adult) (pediatric): Secondary | ICD-10-CM | POA: Diagnosis not present

## 2016-10-28 ENCOUNTER — Ambulatory Visit (INDEPENDENT_AMBULATORY_CARE_PROVIDER_SITE_OTHER): Payer: BLUE CROSS/BLUE SHIELD | Admitting: Family Medicine

## 2016-10-28 ENCOUNTER — Encounter: Payer: Self-pay | Admitting: Family Medicine

## 2016-10-28 VITALS — BP 133/76 | HR 74 | Wt 218.0 lb

## 2016-10-28 DIAGNOSIS — M546 Pain in thoracic spine: Secondary | ICD-10-CM

## 2016-10-28 DIAGNOSIS — M25552 Pain in left hip: Secondary | ICD-10-CM

## 2016-10-28 DIAGNOSIS — Z23 Encounter for immunization: Secondary | ICD-10-CM

## 2016-10-28 DIAGNOSIS — M5442 Lumbago with sciatica, left side: Secondary | ICD-10-CM

## 2016-10-28 DIAGNOSIS — M25859 Other specified joint disorders, unspecified hip: Secondary | ICD-10-CM | POA: Diagnosis not present

## 2016-10-28 MED ORDER — CYCLOBENZAPRINE HCL 10 MG PO TABS: 10.0000 mg | ORAL_TABLET | Freq: Three times a day (TID) | ORAL | 0 refills | Status: DC | PRN

## 2016-10-28 MED ORDER — HYDROCODONE-ACETAMINOPHEN 10-325 MG PO TABS: 0.5000 | ORAL_TABLET | Freq: Three times a day (TID) | ORAL | 0 refills | Status: DC | PRN

## 2016-10-28 NOTE — Patient Instructions (Signed)
Thank you for coming in today. Recheck for orthotics.  Recheck sooner if needed.  Attend PT as needed.  Use a heating pad.  Use a TENS unit.  Take flexeril at bed time for muscle spasm.

## 2016-10-28 NOTE — Progress Notes (Signed)
Vincent Wall is a 56 y.o. male who presents to [facility] today for back pain.  Patient notes pain in the right lateral thoracic back.  The pain radiates to the more lateral thoracic back with overhead motion.  He denies any pain with deep inspiration or cough.  He denies shortness of breath fevers or chills nausea vomiting or diarrhea.  The pain is been ongoing for 4 weeks and occurred after he did some yard work.  He denies any specific injury or fall however.  He has been treating at home with a heating pad TENS unit which does help.  The pain is worse with activity and when lying on his side at sleep.   Past Medical History:  Diagnosis Date  . Heart attack (HCC)   . Heart disease   . Hyperlipidemia   . Hypertension    Past Surgical History:  Procedure Laterality Date  . CORONARY STENT PLACEMENT  05/2010  . ELBOW SURGERY  2010   Social History  Substance Use Topics  . Smoking status: Current Every Day Smoker    Packs/day: 1.00    Years: 20.00    Types: Cigarettes  . Smokeless tobacco: Never Used  . Alcohol use 0.5 oz/week    1 Standard drinks or equivalent per week     ROS:  As above   Medications: Current Outpatient Prescriptions  Medication Sig Dispense Refill  . albuterol (PROVENTIL HFA;VENTOLIN HFA) 108 (90 Base) MCG/ACT inhaler Inhale 2 puffs into the lungs every 6 (six) hours as needed for wheezing or shortness of breath. 1 Inhaler 3  . aspirin 81 MG tablet Take 81 mg by mouth daily.    Marland Kitchen gabapentin (NEURONTIN) 300 MG capsule ONE CAP AT BEDT FOR A WEEK, 2XDAY FOR A WEEK, 3XDAY, MAY DOUBLE WEEKLY TO A MAX OF 3,600MG /DAY 180 capsule 3  . HYDROcodone-acetaminophen (NORCO) 10-325 MG tablet Take 0.5-1 tablets by mouth every 8 (eight) hours as needed for moderate pain. 60 tablet 0  . lisinopril-hydrochlorothiazide (PRINZIDE,ZESTORETIC) 20-12.5 MG tablet Take 1 tablet by mouth daily. 90 tablet 3  . LIVALO 2 MG TABS ONE BY MOUTH  DAILY. 90 tablet 1  . metoprolol succinate (TOPROL-XL) 50 MG 24 hr tablet TAKE 1 TABLET (50 MG TOTAL) BY MOUTH DAILY. TAKE WITH OR IMMEDIATELY FOLLOWING A MEAL. 90 tablet 3  . nitroGLYCERIN (NITROSTAT) 0.4 MG SL tablet Place 1 tablet (0.4 mg total) under the tongue every 5 (five) minutes as needed for chest pain. 50 tablet 3  . omeprazole (PRILOSEC) 20 MG capsule Take 1 capsule (20 mg total) by mouth daily. 90 capsule 3  . prasugrel (EFFIENT) 10 MG TABS tablet Take 1 tablet (10 mg total) by mouth daily. 90 tablet 3  . temazepam (RESTORIL) 30 MG capsule TAKE ONE CAPSULE BY MOUTH AT BEDTIME AS NEEDED FOR SLEEP 30 capsule 4  . cyclobenzaprine (FLEXERIL) 10 MG tablet Take 1 tablet (10 mg total) by mouth 3 (three) times daily as needed for muscle spasms. 30 tablet 0   No current facility-administered medications for this visit.    Allergies  Allergen Reactions  . Crestor [Rosuvastatin]     Myalgia    . Lipitor [Atorvastatin]     Myalgias  . Statins     Joint pain; pt states his body "locked up"     Exam:  BP 133/76   Pulse 74   Wt 218 lb (98.9 kg)   BMI 33.15 kg/m  General: Well Developed, well  nourished, and in no acute distress.  Neuro/Psych: Alert and oriented x3, extra-ocular muscles intact, able to move all 4 extremities, sensation grossly intact. Skin: Warm and dry, no rashes noted.  Respiratory: Not using accessory muscles, speaking in full sentences, trachea midline.  Cardiovascular: Pulses palpable, no extremity edema. Abdomen: Does not appear distended. MSK: Nontender along the thoracic and lumbar midline.  Mildly tender appropriation at the inferior portion of the scapula.  Pain is reproducible with arm abduction. Sensation is intact.  X-ray right ribs pending    No results found for this or any previous visit (from the past 48 hour(s)). No results found.    Assessment and Plan: 56 y.o. male with pain in the right thorax likely associated with either the  latissimus dorsi or serratus anterior muscle group. 4 weeks is a bit long for this.  Plan to be a little bit more aggressive with treatment including referral to physical therapy, cyclobenzaprine, Norco heating pad and TENS unit.  Will obtain an x-ray of the right ribs to assess for other etiologies including malignancy.  Recheck if not better.   [place].  Orders Placed This Encounter  Procedures  . DG Ribs Unilateral Right    Standing Status:   Future    Standing Expiration Date:   12/28/2017    Order Specific Question:   Reason for Exam (SYMPTOM  OR DIAGNOSIS REQUIRED)    Answer:   eval pain right lateral rib    Order Specific Question:   Preferred imaging location?    Answer[facility]   [facility]    Order Specific Question:   Radiology Contrast Protocol - do NOT remove file path    Answer:   [email or web address]  . Flu Vaccine QUAD 36+ mos IM  . Ambulatory referral to Physical Therapy    Referral Priority:   Routine    Referral Type:   Physical Medicine    Referral Reason:   Specialty Services Required    Requested Specialty:   Physical Therapy   Meds ordered this encounter  Medications  . cyclobenzaprine (FLEXERIL) 10 MG tablet    Sig: Take 1 tablet (10 mg total) by mouth 3 (three) times daily as needed for muscle spasms.    Dispense:  30 tablet    Refill:  0  . HYDROcodone-acetaminophen (NORCO) 10-325 MG tablet    Sig: Take 0.5-1 tablets by mouth every 8 (eight) hours as needed for moderate pain.    Dispense:  60 tablet    Refill:  0    Discussed warning signs or symptoms. Please see discharge instructions. Patient expresses understanding.

## 2016-12-20 ENCOUNTER — Ambulatory Visit: Payer: BLUE CROSS/BLUE SHIELD | Admitting: Family Medicine

## 2016-12-20 ENCOUNTER — Encounter: Payer: Self-pay | Admitting: Family Medicine

## 2016-12-20 VITALS — BP 112/76 | HR 80 | Ht 68.0 in | Wt 220.0 lb

## 2016-12-20 DIAGNOSIS — G4733 Obstructive sleep apnea (adult) (pediatric): Secondary | ICD-10-CM

## 2016-12-20 DIAGNOSIS — M25859 Other specified joint disorders, unspecified hip: Secondary | ICD-10-CM | POA: Diagnosis not present

## 2016-12-20 DIAGNOSIS — I10 Essential (primary) hypertension: Secondary | ICD-10-CM

## 2016-12-20 DIAGNOSIS — I2510 Atherosclerotic heart disease of native coronary artery without angina pectoris: Secondary | ICD-10-CM | POA: Diagnosis not present

## 2016-12-20 DIAGNOSIS — M5416 Radiculopathy, lumbar region: Secondary | ICD-10-CM

## 2016-12-20 DIAGNOSIS — Z125 Encounter for screening for malignant neoplasm of prostate: Secondary | ICD-10-CM | POA: Diagnosis not present

## 2016-12-20 DIAGNOSIS — M25552 Pain in left hip: Secondary | ICD-10-CM | POA: Diagnosis not present

## 2016-12-20 DIAGNOSIS — I700 Atherosclerosis of aorta: Secondary | ICD-10-CM | POA: Diagnosis not present

## 2016-12-20 DIAGNOSIS — J0101 Acute recurrent maxillary sinusitis: Secondary | ICD-10-CM

## 2016-12-20 DIAGNOSIS — M5442 Lumbago with sciatica, left side: Secondary | ICD-10-CM

## 2016-12-20 MED ORDER — PREDNISONE 10 MG PO TABS: 30.0000 mg | ORAL_TABLET | Freq: Every day | ORAL | 0 refills | Status: DC

## 2016-12-20 MED ORDER — HYDROCODONE-ACETAMINOPHEN 10-325 MG PO TABS: 0.5000 | ORAL_TABLET | Freq: Three times a day (TID) | ORAL | 0 refills | Status: DC | PRN

## 2016-12-20 MED ORDER — LISINOPRIL-HYDROCHLOROTHIAZIDE 20-12.5 MG PO TABS: 1.0000 | ORAL_TABLET | Freq: Every day | ORAL | 3 refills | Status: DC

## 2016-12-20 MED ORDER — PRASUGREL HCL 10 MG PO TABS: 10.0000 mg | ORAL_TABLET | Freq: Every day | ORAL | 3 refills | Status: DC

## 2016-12-20 MED ORDER — TEMAZEPAM 30 MG PO CAPS: 30.0000 mg | ORAL_CAPSULE | Freq: Every evening | ORAL | 5 refills | Status: DC | PRN

## 2016-12-20 MED ORDER — PITAVASTATIN CALCIUM 2 MG PO TABS: ORAL_TABLET | ORAL | 1 refills | Status: DC

## 2016-12-20 MED ORDER — GABAPENTIN 300 MG PO CAPS: 300.0000 mg | ORAL_CAPSULE | Freq: Three times a day (TID) | ORAL | 3 refills | Status: DC

## 2016-12-20 MED ORDER — AZITHROMYCIN 250 MG PO TABS: 250.0000 mg | ORAL_TABLET | Freq: Every day | ORAL | 0 refills | Status: DC

## 2016-12-20 MED ORDER — METOPROLOL SUCCINATE ER 50 MG PO TB24
50.0000 mg | ORAL_TABLET | Freq: Every day | ORAL | 3 refills | Status: DC
Start: 2016-12-20 — End: 2017-06-20

## 2016-12-20 MED ORDER — OMEPRAZOLE 20 MG PO CPDR: 20.0000 mg | DELAYED_RELEASE_CAPSULE | Freq: Every day | ORAL | 3 refills | Status: DC

## 2016-12-20 NOTE — Progress Notes (Signed)
Vincent Wall is a 56 y.o. male who presents to [facility]: Primary Care Sports Medicine today for sinusitis, HLD, HTN, Smoking, Back Pain.   Vincent Wall has a several day history of sinus pain and pressure and congestion.  He notes this typically leads to bronchitis and is worried that he will get sicker.  He denies any trouble breathing or wheezing.  He has tried some over-the-counter medicines which have helped.  He has albuterol at home but has not felt that he needs to use it yet.  Hypertension: Vincent Wall takes lisinopril/hydrochlorothiazide and metoprolol listed below.  He tolerates the medication well with no chest pain palpitations lightheadedness or dizziness.  Hyperlipidemia: Vincent Wall takes Livalo.  He tolerates this medication well but has trouble tolerating other statins.  He denies any significant muscle aches or pain.  Back pain: Vincent Wall has chronic back pain with radiculopathy.  He uses gabapentin and hydrocodone intermittently. He feels well and is able to work with these medicines.   Smoking: Vincent Wall continues to smoke about a pack a day.  He is trying to cut back but not interested in quitting at this time.  Past Medical History:  Diagnosis Date  . Heart attack (HCC)   . Heart disease   . Hyperlipidemia   . Hypertension    Past Surgical History:  Procedure Laterality Date  . CORONARY STENT PLACEMENT  05/2010  . ELBOW SURGERY  2010   Social History   Tobacco Use  . Smoking status: Current Every Day Smoker    Packs/day: 1.00    Years: 20.00    Pack years: 20.00    Types: Cigarettes  . Smokeless tobacco: Never Used  Substance Use Topics  . Alcohol use: Yes    Alcohol/week: 0.5 oz    Types: 1 Standard drinks or equivalent per week   family history includes Breast cancer in his unknown relative; Diabetes in his unknown relative; Heart attack in his unknown relative; Hyperlipidemia in his  unknown relative; Hypertension in his unknown relative.  ROS as above:  Medications: Current Outpatient Medications  Medication Sig Dispense Refill  . albuterol (PROVENTIL HFA;VENTOLIN HFA) 108 (90 Base) MCG/ACT inhaler Inhale 2 puffs into the lungs every 6 (six) hours as needed for wheezing or shortness of breath. 1 Inhaler 3  . aspirin 81 MG tablet Take 81 mg by mouth daily.    . cyclobenzaprine (FLEXERIL) 10 MG tablet Take 1 tablet (10 mg total) by mouth 3 (three) times daily as needed for muscle spasms. 30 tablet 0  . gabapentin (NEURONTIN) 300 MG capsule Take 1 capsule (300 mg total) by mouth 3 (three) times daily. 180 capsule 3  . HYDROcodone-acetaminophen (NORCO) 10-325 MG tablet Take 0.5-1 tablets by mouth every 8 (eight) hours as needed for moderate pain. 60 tablet 0  . lisinopril-hydrochlorothiazide (PRINZIDE,ZESTORETIC) 20-12.5 MG tablet Take 1 tablet by mouth daily. 90 tablet 3  . metoprolol succinate (TOPROL-XL) 50 MG 24 hr tablet Take 1 tablet (50 mg total) by mouth daily. Take with or immediately following a meal. 90 tablet 3  . nitroGLYCERIN (NITROSTAT) 0.4 MG SL tablet Place 1 tablet (0.4 mg total) under the tongue every 5 (five) minutes as needed for chest pain. 50 tablet 3  . omeprazole (PRILOSEC) 20 MG capsule Take 1 capsule (20 mg total) by mouth daily. 90 capsule 3  . Pitavastatin Calcium (LIVALO) 2 MG TABS ONE BY MOUTH DAILY. 90 tablet 1  . prasugrel (EFFIENT) 10 MG TABS tablet  Take 1 tablet (10 mg total) by mouth daily. 90 tablet 3  . temazepam (RESTORIL) 30 MG capsule Take 1 capsule (30 mg total) by mouth at bedtime as needed. for sleep 30 capsule 5  . azithromycin (ZITHROMAX) 250 MG tablet Take 1 tablet (250 mg total) by mouth daily. Take first 2 tablets together, then 1 every day until finished. 6 tablet 0  . predniSONE (DELTASONE) 10 MG tablet Take 3 tablets (30 mg total) by mouth daily with breakfast. 15 tablet 0   No current facility-administered medications for  this visit.    Allergies  Allergen Reactions  . Crestor [Rosuvastatin]     Myalgia    . Lipitor [Atorvastatin]     Myalgias  . Statins     Joint pain; pt states his body "locked up"    Health Maintenance Health Maintenance  Topic Date Due  . HIV Screening  09/02/2018 (Originally 01/05/1975)  . COLONOSCOPY  01/05/2023 (Originally 01/06/2008)  . TETANUS/TDAP  12/06/2018  . INFLUENZA VACCINE  Completed  . Hepatitis C Screening  Completed     Exam:  BP 112/76   Pulse 80   Ht 5\' 8"  (1.727 m)   Wt 220 lb (99.8 kg)   BMI 33.45 kg/m  Gen: Well NAD HEENT: EOMI,  MMM, clear nasal discharge.  Posterior pharynx with cobblestoning.  Normal tympanic membranes.  No cervical lymphadenopathy. Lungs: Normal work of breathing. CTABL Heart: RRR no MRG Abd: NABS, Soft. Nondistended, Nontender Exts: Brisk capillary refill, warm and well perfused.    No results found for this or any previous visit (from the past 72 hour(s)). No results found.    Assessment and Plan: 56 y.o. male with  Sinusitis viral at this point.  Plan to treat with over-the-counter medications and prednisone.  If symptoms worsen backup azithromycin was printed to use if secondary sickening present.  Hypertension: Well-controlled continue current regimen.  Check metabolic panel.  Hyperlipidemia: Tolerating Livalo well.  Check fasting lipids and metabolic panel.  Chronic pain: Doing well.  Continue intermittent occasional hydrocodone and gabapentin. Patient researched [facility] Controlled Substance Reporting System.  Smoking: Work on cutting back.  Prostate cancer screening PSA ordered today   Orders Placed This Encounter  Procedures  . CBC  . COMPLETE METABOLIC PANEL WITH GFR  . Lipid Panel w/reflex Direct LDL  . PSA  . Hemoglobin A1c   Meds ordered this encounter  Medications  . gabapentin (NEURONTIN) 300 MG capsule    Sig: Take 1 capsule (300 mg total) by mouth 3 (three) times daily.    Dispense:   180 capsule    Refill:  3  . HYDROcodone-acetaminophen (NORCO) 10-325 MG tablet    Sig: Take 0.5-1 tablets by mouth every 8 (eight) hours as needed for moderate pain.    Dispense:  60 tablet    Refill:  0  . lisinopril-hydrochlorothiazide (PRINZIDE,ZESTORETIC) 20-12.5 MG tablet    Sig: Take 1 tablet by mouth daily.    Dispense:  90 tablet    Refill:  3  . Pitavastatin Calcium (LIVALO) 2 MG TABS    Sig: ONE BY MOUTH DAILY.    Dispense:  90 tablet    Refill:  1  . metoprolol succinate (TOPROL-XL) 50 MG 24 hr tablet    Sig: Take 1 tablet (50 mg total) by mouth daily. Take with or immediately following a meal.    Dispense:  90 tablet    Refill:  3  . omeprazole (PRILOSEC) 20 MG capsule  Sig: Take 1 capsule (20 mg total) by mouth daily.    Dispense:  90 capsule    Refill:  3  . prasugrel (EFFIENT) 10 MG TABS tablet    Sig: Take 1 tablet (10 mg total) by mouth daily.    Dispense:  90 tablet    Refill:  3  . temazepam (RESTORIL) 30 MG capsule    Sig: Take 1 capsule (30 mg total) by mouth at bedtime as needed. for sleep    Dispense:  30 capsule    Refill:  5  . predniSONE (DELTASONE) 10 MG tablet    Sig: Take 3 tablets (30 mg total) by mouth daily with breakfast.    Dispense:  15 tablet    Refill:  0  . azithromycin (ZITHROMAX) 250 MG tablet    Sig: Take 1 tablet (250 mg total) by mouth daily. Take first 2 tablets together, then 1 every day until finished.    Dispense:  6 tablet    Refill:  0     Discussed warning signs or symptoms. Please see discharge instructions. Patient expresses understanding.

## 2016-12-20 NOTE — Patient Instructions (Addendum)
Thank you for coming in today. Get labs today.  Continue over the counter medicines as needed.  Take prednisone.  If worse take azithromycin antibiotic.  Work on cutting back smoking.  Return sooner if needed.  If all is well recheck in 6 months.

## 2016-12-21 ENCOUNTER — Other Ambulatory Visit: Payer: Self-pay | Admitting: Family Medicine

## 2016-12-21 DIAGNOSIS — I2510 Atherosclerotic heart disease of native coronary artery without angina pectoris: Secondary | ICD-10-CM

## 2016-12-21 LAB — COMPLETE METABOLIC PANEL WITH GFR
AG Ratio: 1.4 (calc) (ref 1.0–2.5)
ALBUMIN MSPROF: 3.8 g/dL (ref 3.6–5.1)
ALT: 26 U/L (ref 9–46)
AST: 17 U/L (ref 10–35)
Alkaline phosphatase (APISO): 73 U/L (ref 40–115)
BUN: 23 mg/dL (ref 7–25)
CALCIUM: 9.1 mg/dL (ref 8.6–10.3)
CO2: 30 mmol/L (ref 20–32)
CREATININE: 1.01 mg/dL (ref 0.70–1.33)
Chloride: 102 mmol/L (ref 98–110)
GFR, EST AFRICAN AMERICAN: 95 mL/min/{1.73_m2} (ref >=60)
GFR, EST NON AFRICAN AMERICAN: 82 mL/min/{1.73_m2} (ref >=60)
GLOBULIN: 2.7 g/dL (ref 1.9–3.7)
Glucose, Bld: 141 mg/dL — ABNORMAL HIGH (ref 65–99)
Potassium: 4.5 mmol/L (ref 3.5–5.3)
SODIUM: 137 mmol/L (ref 135–146)
TOTAL PROTEIN: 6.5 g/dL (ref 6.1–8.1)
Total Bilirubin: 0.9 mg/dL (ref 0.2–1.2)

## 2016-12-21 LAB — CBC
HEMATOCRIT: 43.6 % (ref 38.5–50.0)
HEMOGLOBIN: 14 g/dL (ref 13.2–17.1)
MCH: 26.9 pg — ABNORMAL LOW (ref 27.0–33.0)
MCHC: 32.1 g/dL (ref 32.0–36.0)
MCV: 83.7 fL (ref 80.0–100.0)
MPV: 10.3 fL (ref 7.5–12.5)
Platelets: 224 10*3/uL (ref 140–400)
RBC: 5.21 10*6/uL (ref 4.20–5.80)
RDW: 14.2 % (ref 11.0–15.0)
WBC: 8.5 10*3/uL (ref 3.8–10.8)

## 2016-12-21 LAB — HEMOGLOBIN A1C
EAG (MMOL/L): 7.1 (calc)
Hgb A1c MFr Bld: 6.1 % of total Hgb — ABNORMAL HIGH (ref <5.7)
Mean Plasma Glucose: 128 (calc)

## 2016-12-21 LAB — LIPID PANEL W/REFLEX DIRECT LDL
CHOL/HDL RATIO: 5.2 (calc) — AB (ref <5.0)
Cholesterol: 172 mg/dL (ref <200)
HDL: 33 mg/dL — ABNORMAL LOW (ref >40)
LDL CHOLESTEROL (CALC): 111 mg/dL — AB
NON-HDL CHOLESTEROL (CALC): 139 mg/dL — AB (ref <130)
Triglycerides: 158 mg/dL — ABNORMAL HIGH (ref <150)

## 2016-12-21 LAB — PSA: PSA: 0.6 ng/mL (ref <=4.0)

## 2016-12-21 MED ORDER — PITAVASTATIN CALCIUM 4 MG PO TABS: 1.0000 | ORAL_TABLET | Freq: Every day | ORAL | 3 refills | Status: DC

## 2017-01-25 ENCOUNTER — Encounter: Payer: Self-pay | Admitting: Family Medicine

## 2017-01-25 ENCOUNTER — Ambulatory Visit: Payer: BLUE CROSS/BLUE SHIELD | Admitting: Family Medicine

## 2017-01-25 VITALS — BP 121/76 | HR 75 | Temp 97.4°F | Wt 219.0 lb

## 2017-01-25 DIAGNOSIS — H6521 Chronic serous otitis media, right ear: Secondary | ICD-10-CM | POA: Diagnosis not present

## 2017-01-25 DIAGNOSIS — E782 Mixed hyperlipidemia: Secondary | ICD-10-CM

## 2017-01-25 MED ORDER — CEFDINIR 300 MG PO CAPS: 300.0000 mg | ORAL_CAPSULE | Freq: Two times a day (BID) | ORAL | 0 refills | Status: DC

## 2017-01-25 MED ORDER — PREDNISONE 10 MG PO TABS: 30.0000 mg | ORAL_TABLET | Freq: Every day | ORAL | 0 refills | Status: DC

## 2017-01-25 NOTE — Patient Instructions (Addendum)
Thank you for coming in today. Take prednisone daily for 5 days.  Return if not better.  Take Omnicef if not better.    Barotitis Media Barotitis media is inflammation of the middle ear. This condition occurs when an auditory tube (eustachian tube) is blocked in one or both ears. These tubes lead from the middle ear to the back of the nose (nasopharynx). This condition typically occurs when you experience changes in pressure, such as when flying or scuba diving. Untreated barotitis media may lead to damage or hearing loss (barotrauma), which may become permanent. What are the causes? This condition may be caused by changes in air pressure from:  Flying.  Scuba diving.  A nearby explosion.  What increases the risk? The following factors may make you more likely to develop this condition:  Middle ear infection.  Sinus infection.  A cold.  Environmental allergies.  Small eustachian tubes.  Recent ear surgery.  What are the signs or symptoms? Symptoms of this condition may include:  Ear pain.  Hearing loss.  In severe cases, symptoms can include:  Dizziness and nausea (vertigo).  Temporary facial paralysis.  How is this diagnosed? This condition is diagnosed based on:  A physical exam. Your health care provider may: ? Use a device (otoscope) to look into your ear canal and check your eardrum. ? Do a test that changes air pressure in the middle ear to check how well the eardrum moves and to see if the eustachian tube is working(tympanogram).  Your medical history.  In some cases, your health care provider may have you take a hearing test. You may also be referred to someone who specializes in ear treatment (otolaryngologist, "ENT"). How is this treated? This condition may be treated with:  Medicines to relieve congestion in your nose, sinus, or upper respiratory tract (decongestants).  Techniques to equalize pressure (to "pop" your ears), such  as: ? Yawning. ? Chewing gum. ? Swallowing.  In severe cases, you may need surgery to relieve your symptoms or to prevent future inflammation. Follow these instructions at home:  Take over-the-counter and prescription medicines only as told by your health care provider.  Do not put anything into your ears to clean or unplug them. Ear drops will not help.  Keep all follow-up visits as told by your health care provider. This is important. How is this prevented? Using these strategies may help to prevent barotitis media:  Chewing gum with frequent, forceful swallowing during takeoff and landing when flying.  Holding your nose and gently blowing to pop your ears for equalizing pressure changes. This forces air into the eustachian tube.  Yawning during air pressure changes.  Using a nasal decongestant about 30-60 minutes before flying, if you have nasal congestion.  Contact a health care provider if:  You have vertigo.  You have hearing loss.  Your symptoms do not get better or they get worse.  You have a fever. Get help right away if:  You have a severe headache, ear pain, and dizziness.  You have balance problems.  You cannot move or feel part of your face.  You have bloody or pus-like drainage from your ears. Summary  Barotitis media is inflammation of the middle ear.  This condition typically occurs when you experience changes in pressure, such as when flying or scuba diving.  You may be at a higher risk for this condition if you have small eustachian tubes, had recent ear surgery, or have allergies, a cold, or sinus  or middle ear infection.  This condition may be treated with medicines or techniques to equalize pressure in your ears.  Strategies can be used to help prevent barotitis media. This information is not intended to replace advice given to you by your health care provider. Make sure you discuss any questions you have with your health care  provider. Document Released: 12/19/1999 Document Revised: 11/10/2015 Document Reviewed: 11/10/2015 Elsevier Interactive Patient Education  2017 [employer].

## 2017-01-25 NOTE — Progress Notes (Signed)
Rennie NatterKevin Corrow is a 57 y.o. male who [facility]: Primary [facility] today for bilateral ear pressure.  [name] was seen for sinusitis last month he was treated with steroids and antibiotics which typically work.  He notes he is feeling better except for consistent bilateral ear pressure and decreased hearing.  He notes this is pretty typical for this time a year and typically gets better with over-the-counter treatment and nasal sprays.  He has been using Flonase and over-the-counter medicines which have not helped much.  He denies any significant pain.  He notes that he is tolerating Livalo well for his hyperlipidemia.   Past Medical History:  Diagnosis Date  . Heart attack (HCC)   . Heart disease   . Hyperlipidemia   . Hypertension    Past Surgical History:  Procedure Laterality Date  . CORONARY STENT PLACEMENT  05/2010  . ELBOW SURGERY  2010   Social History   Tobacco Use  . Smoking status: Current Every Day Smoker    Packs/day: 1.00    Years: 20.00    Pack years: 20.00    Types: Cigarettes  . Smokeless tobacco: Never Used  Substance Use Topics  . Alcohol use: Yes    Alcohol/week: 0.5 oz    Types: 1 Standard drinks or equivalent per week   family history includes Breast cancer in his unknown relative; Diabetes in his unknown relative; Heart attack in his unknown relative; Hyperlipidemia in his unknown relative; Hypertension in his unknown relative.  ROS as above:  Medications: Current Outpatient Medications  Medication Sig Dispense Refill  . albuterol (PROVENTIL HFA;VENTOLIN HFA) 108 (90 Base) MCG/ACT inhaler Inhale 2 puffs into the lungs every 6 (six) hours as needed for wheezing or shortness of breath. 1 Inhaler 3  . aspirin 81 MG tablet Take 81 mg by mouth daily.    . cyclobenzaprine (FLEXERIL) 10 MG tablet Take 1 tablet (10 mg total) by mouth 3 (three) times  daily as needed for muscle spasms. 30 tablet 0  . gabapentin (NEURONTIN) 300 MG capsule Take 1 capsule (300 mg total) by mouth 3 (three) times daily. 180 capsule 3  . lisinopril-hydrochlorothiazide (PRINZIDE,ZESTORETIC) 20-12.5 MG tablet Take 1 tablet by mouth daily. 90 tablet 3  . metoprolol succinate (TOPROL-XL) 50 MG 24 hr tablet Take 1 tablet (50 mg total) by mouth daily. Take with or immediately following a meal. 90 tablet 3  . nitroGLYCERIN (NITROSTAT) 0.4 MG SL tablet Place 1 tablet (0.4 mg total) under the tongue every 5 (five) minutes as needed for chest pain. 50 tablet 3  . omeprazole (PRILOSEC) 20 MG capsule Take 1 capsule (20 mg total) by mouth daily. 90 capsule 3  . Pitavastatin Calcium 4 MG TABS Take 1 tablet (4 mg total) by mouth daily. 90 tablet 3  . prasugrel (EFFIENT) 10 MG TABS tablet Take 1 tablet (10 mg total) by mouth daily. 90 tablet 3  . temazepam (RESTORIL) 30 MG capsule Take 1 capsule (30 mg total) by mouth at bedtime as needed. for sleep 30 capsule 5  . cefdinir (OMNICEF) 300 MG capsule Take 1 capsule (300 mg total) by mouth 2 (two) times daily. 14 capsule 0  . predniSONE (DELTASONE) 10 MG tablet Take 3 tablets (30 mg total) by mouth daily with breakfast. 15 tablet 0   No current facility-administered medications for this visit.    Allergies  Allergen Reactions  . Crestor [Rosuvastatin]     Myalgia    .  Lipitor [Atorvastatin]     Myalgias  . Statins     Joint pain; pt states his body "locked up"    Health Maintenance Health Maintenance  Topic Date Due  . HIV Screening  09/02/2018 (Originally 01/05/1975)  . COLONOSCOPY  01/05/2023 (Originally 01/06/2008)  . TETANUS/TDAP  12/06/2018  . INFLUENZA VACCINE  Completed  . Hepatitis C Screening  Completed     Exam:  BP 121/76   Pulse 75   Temp (!) 97.4 F (36.3 C) (Oral)   Wt 219 lb (99.3 kg)   BMI 33.30 kg/m  Gen: Well NAD HEENT: EOMI,  MMM serous effusion right normal left no erythema.  Nontender  mastoids bilaterally. Lungs: Normal work of breathing. CTABL Heart: RRR no MRG Abd: NABS, Soft. Nondistended, Nontender Exts: Brisk capillary refill, warm and well perfused.    No results found for this or any previous visit (from the past 72 hour(s)). No results found.    Assessment and Plan: 57 y.o. male with eustachian tube dysfunction and serous effusion right ear.  Discussed options.  Trial of repeat prednisone course.  Backup antibiotics as needed.  Hyperlipidemia: Continue Livalo.  Recheck labs in a few months.   No orders of the defined types were placed in this encounter.  Meds ordered this encounter  Medications  . predniSONE (DELTASONE) 10 MG tablet    Sig: Take 3 tablets (30 mg total) by mouth daily with breakfast.    Dispense:  15 tablet    Refill:  0  . cefdinir (OMNICEF) 300 MG capsule    Sig: Take 1 capsule (300 mg total) by mouth 2 (two) times daily.    Dispense:  14 capsule    Refill:  0     Discussed warning signs or symptoms. Please see discharge instructions. Patient expresses understanding.

## 2017-02-16 ENCOUNTER — Ambulatory Visit: Payer: BLUE CROSS/BLUE SHIELD | Admitting: Family Medicine

## 2017-02-16 ENCOUNTER — Encounter: Payer: Self-pay | Admitting: Family Medicine

## 2017-02-16 VITALS — BP 117/76 | HR 86 | Ht 68.0 in | Wt 215.0 lb

## 2017-02-16 DIAGNOSIS — G8929 Other chronic pain: Secondary | ICD-10-CM

## 2017-02-16 DIAGNOSIS — M722 Plantar fascial fibromatosis: Secondary | ICD-10-CM | POA: Diagnosis not present

## 2017-02-16 DIAGNOSIS — M545 Low back pain, unspecified: Secondary | ICD-10-CM

## 2017-02-16 MED ORDER — HYDROCODONE-ACETAMINOPHEN 10-325 MG PO TABS: 1.0000 | ORAL_TABLET | Freq: Three times a day (TID) | ORAL | 0 refills | Status: DC | PRN

## 2017-02-16 NOTE — Progress Notes (Signed)
Vincent NatterKevin Wall is a 57 y.o. male who presents to [facility] today for foot pain bilaterally.  [name] has a long history of right plantar fasciitis.  He gets occasional steroid injections his last one being August 2018.  He has cut orthotics which definitely help.  He notes that he is been increasing his activity recently and has been a lot more walking than usual and notes worsening pain in the right plantar calcaneus.  He notes the pain is consistent with plantar fasciitis and he would like a repeat injection if possible.  Additionally he notes new onset of the left plantar fasciitis.  He notes this is not really been a problem for him previously but the pain is present at the plantar calcaneus and consisted entirely with right-sided plantar fasciitis symptoms.  He notes pain is worse with walking and better with rest.  He has not had much treatment for this yet.  Additionally notes history of chronic back pain.  This is been ongoing for years and is well managed with intermittent low-dose Norco.  He was last prescribed 60 tablets of hydrocodone in December 2018 and has about 10-15 left.  He notes he had use a little more than usual over the last several days because his back from bothered him a little more.  He thinks the back pain is related to limping from his plantar fasciitis.   Past Medical History:  Diagnosis Date  . Heart attack (HCC)   . Heart disease   . Hyperlipidemia   . Hypertension    Past Surgical History:  Procedure Laterality Date  . CORONARY STENT PLACEMENT  05/2010  . ELBOW SURGERY  2010   Social History   Tobacco Use  . Smoking status: Current Every Day Smoker    Packs/day: 1.00    Years: 20.00    Pack years: 20.00    Types: Cigarettes  . Smokeless tobacco: Never Used  Substance Use Topics  . Alcohol use: Yes    Alcohol/week: 0.5 oz    Types: 1 Standard drinks or equivalent per week     ROS:  As  above   Medications: Current Outpatient Medications  Medication Sig Dispense Refill  . albuterol (PROVENTIL HFA;VENTOLIN HFA) 108 (90 Base) MCG/ACT inhaler Inhale 2 puffs into the lungs every 6 (six) hours as needed for wheezing or shortness of breath. 1 Inhaler 3  . aspirin 81 MG tablet Take 81 mg by mouth daily.    . cefdinir (OMNICEF) 300 MG capsule Take 1 capsule (300 mg total) by mouth 2 (two) times daily. 14 capsule 0  . cyclobenzaprine (FLEXERIL) 10 MG tablet Take 1 tablet (10 mg total) by mouth 3 (three) times daily as needed for muscle spasms. 30 tablet 0  . gabapentin (NEURONTIN) 300 MG capsule Take 1 capsule (300 mg total) by mouth 3 (three) times daily. 180 capsule 3  . lisinopril-hydrochlorothiazide (PRINZIDE,ZESTORETIC) 20-12.5 MG tablet Take 1 tablet by mouth daily. 90 tablet 3  . metoprolol succinate (TOPROL-XL) 50 MG 24 hr tablet Take 1 tablet (50 mg total) by mouth daily. Take with or immediately following a meal. 90 tablet 3  . nitroGLYCERIN (NITROSTAT) 0.4 MG SL tablet Place 1 tablet (0.4 mg total) under the tongue every 5 (five) minutes as needed for chest pain. 50 tablet 3  . omeprazole (PRILOSEC) 20 MG capsule Take 1 capsule (20 mg total) by mouth daily. 90 capsule 3  . Pitavastatin Calcium 4 MG TABS Take 1 tablet (  4 mg total) by mouth daily. 90 tablet 3  . prasugrel (EFFIENT) 10 MG TABS tablet Take 1 tablet (10 mg total) by mouth daily. 90 tablet 3  . predniSONE (DELTASONE) 10 MG tablet Take 3 tablets (30 mg total) by mouth daily with breakfast. 15 tablet 0  . temazepam (RESTORIL) 30 MG capsule Take 1 capsule (30 mg total) by mouth at bedtime as needed. for sleep 30 capsule 5   No current facility-administered medications for this visit.    Allergies  Allergen Reactions  . Crestor [Rosuvastatin]     Myalgia    . Lipitor [Atorvastatin]     Myalgias  . Statins     Joint pain; pt states his body "locked up"     Exam:  BP 117/76   Pulse 86   Ht 5\' 8"  (1.727  m)   Wt 215 lb (97.5 kg)   BMI 32.69 kg/m  General: Well Developed, well nourished, and in no acute distress.  Neuro/Psych: Alert and oriented x3, extra-ocular muscles intact, able to move all 4 extremities, sensation grossly intact. Skin: Warm and dry, no rashes noted.  Respiratory: Not using accessory muscles, speaking in full sentences, trachea midline.  Cardiovascular: Pulses palpable, no extremity edema. Abdomen: Does not appear distended. MSK:  Right foot normal-appearing Tender to palpation right plantar calcaneus. Normal foot motion.  Pulses capillary fill and sensation intact.  Left foot: Normal-appearing Tender palpation plantar calcaneus.  Foot motion pulses capillary refill and sensation intact.  L-spine normal motion and gait.  Procedure: Real-time Ultrasound Guided Injection of right plantar fascia Device: GE Logiq E  Images permanently stored and available for review in the ultrasound unit. Verbal informed consent obtained. Discussed risks and benefits of procedure. Warned about infection bleeding damage to structures skin hypopigmentation and fat atrophy among others. Patient expresses understanding and agreement Time-out conducted.  Noted no overlying erythema, induration, or other signs of local infection.  Skin prepped in a sterile fashion.  Local anesthesia: Topical Ethyl chloride.  With sterile technique and under real time ultrasound guidance:40 mg Kenalog and 1 mL Marcaine injected easily.  Completed without difficulty  Pain immediately resolved suggesting accurate placement of the medication.  Advised to call if fevers/chills, erythema, induration, drainage, or persistent bleeding.  Images permanently stored and available for review in the ultrasound unit.  Impression: Technically successful ultrasound guided injection.   Procedure: Real-time Ultrasound Guided Injection of left plantar fascia Device: GE Logiq E  Images permanently stored and  available for review in the ultrasound unit. Verbal informed consent obtained. Discussed risks and benefits of procedure. Warned about infection bleeding damage to structures skin hypopigmentation and fat atrophy among others. Patient expresses understanding and agreement Time-out conducted.  Noted no overlying erythema, induration, or other signs of local infection.  Skin prepped in a sterile fashion.  Local anesthesia: Topical Ethyl chloride.  With sterile technique and under real time ultrasound guidance:40 mg of Kenalog and 1 mL Marcaine injected easily.  Completed without difficulty  Pain immediately resolved suggesting accurate placement of the medication.  Advised to call if fevers/chills, erythema, induration, drainage, or persistent bleeding.  Images permanently stored and available for review in the ultrasound unit.  Impression: Technically successful ultrasound guided injection.     No results found for this or any previous visit (from the past 48 hour(s)). No results found.    Assessment and Plan: 57 y.o. male with  Right plantar fasciitis: Exacerbation of chronic problem.  Injected today.  Continue orthotics and  home exercises.  Recheck as needed.  Left plantar fasciitis: New problem.  We will treat with injection and orthotics.  At home exercises and recheck as needed.  Chronic back pain: Ongoing chronic issue.  Norco refilled.  Recheck as needed or within 3 months.  Patient researched [facility] Controlled Substance Reporting System.     No orders of the defined types were placed in this encounter.  No orders of the defined types were placed in this encounter.   Discussed warning signs or symptoms. Please see discharge instructions. Patient expresses understanding.

## 2017-02-16 NOTE — Patient Instructions (Signed)
Thank you for coming in today. Call or go to the ER if you develop a large red swollen joint with extreme pain or oozing puss.  Recheck as needed.  Those insoles look good.

## 2017-02-17 ENCOUNTER — Other Ambulatory Visit: Payer: Self-pay | Admitting: Family Medicine

## 2017-02-24 ENCOUNTER — Ambulatory Visit: Payer: BLUE CROSS/BLUE SHIELD | Admitting: Family Medicine

## 2017-02-28 ENCOUNTER — Encounter: Payer: Self-pay | Admitting: Family Medicine

## 2017-02-28 ENCOUNTER — Ambulatory Visit: Payer: BLUE CROSS/BLUE SHIELD | Admitting: Family Medicine

## 2017-02-28 VITALS — BP 122/77 | HR 71 | Temp 97.7°F | Ht 68.0 in | Wt 214.0 lb

## 2017-02-28 DIAGNOSIS — R05 Cough: Secondary | ICD-10-CM | POA: Diagnosis not present

## 2017-02-28 DIAGNOSIS — M722 Plantar fascial fibromatosis: Secondary | ICD-10-CM | POA: Diagnosis not present

## 2017-02-28 DIAGNOSIS — R059 Cough, unspecified: Secondary | ICD-10-CM

## 2017-02-28 MED ORDER — AZITHROMYCIN 250 MG PO TABS: 250.0000 mg | ORAL_TABLET | Freq: Every day | ORAL | 0 refills | Status: DC

## 2017-02-28 MED ORDER — ALBUTEROL SULFATE HFA 108 (90 BASE) MCG/ACT IN AERS: 2.0000 | INHALATION_SPRAY | Freq: Four times a day (QID) | RESPIRATORY_TRACT | 3 refills | Status: DC | PRN

## 2017-02-28 MED ORDER — PREDNISONE 10 MG PO TABS: 30.0000 mg | ORAL_TABLET | Freq: Every day | ORAL | 0 refills | Status: DC

## 2017-02-28 NOTE — Progress Notes (Signed)
Vincent Wall is a 57 y.o. male who [facility] today for cough.  Vincent Wall notes a persistent bothersome cough over the last 2 weeks.  This initially started with an episode of fevers and chills and runny nose.  The fevers and chills have resolved but the cough remains persistent.  It is productive and associated with wheezing and mild shortness of breath.  He is used his albuterol which does help some.  Additionally he is used some leftover codeine cough medication which is helped a bit as well.  He continues to use over-the-counter medications which are also helpful.  He denies chest pain or palpitations.  The cough is worse at bedtime and better during the day.   Plantar fasciitis: Vincent Wall was seen last week for bilateral plantar fasciitis.  He had bilateral facet injections and notes that his right foot feels great but the left foot is still bothering him.  Past Medical History:  Diagnosis Date  . Heart attack (HCC)   . Heart disease   . Hyperlipidemia   . Hypertension    Past Surgical History:  Procedure Laterality Date  . CORONARY STENT PLACEMENT  05/2010  . ELBOW SURGERY  2010   Social History   Tobacco Use  . Smoking status: Current Every Day Smoker    Packs/day: 1.00    Years: 20.00    Pack years: 20.00    Types: Cigarettes  . Smokeless tobacco: Never Used  Substance Use Topics  . Alcohol use: Yes    Alcohol/week: 0.5 oz    Types: 1 Standard drinks or equivalent per week   family history includes Breast cancer in his unknown relative; Diabetes in his unknown relative; Heart attack in his unknown relative; Hyperlipidemia in his unknown relative; Hypertension in his unknown relative.  ROS as above:  Medications: Current Outpatient Medications  Medication Sig Dispense Refill  . albuterol (PROVENTIL HFA;VENTOLIN HFA) 108 (90 Base) MCG/ACT inhaler  Inhale 2 puffs into the lungs every 6 (six) hours as needed for wheezing or shortness of breath. 1 Inhaler 3  . aspirin 81 MG tablet Take 81 mg by mouth daily.    Marland Kitchen. gabapentin (NEURONTIN) 300 MG capsule Take 1 capsule (300 mg total) by mouth 3 (three) times daily. 180 capsule 3  . HYDROcodone-acetaminophen (NORCO) 10-325 MG tablet Take 1-2 tablets by mouth every 8 (eight) hours as needed. 60 tablet 0  . lisinopril-hydrochlorothiazide (PRINZIDE,ZESTORETIC) 20-12.5 MG tablet Take 1 tablet by mouth daily. 90 tablet 3  . metoprolol succinate (TOPROL-XL) 50 MG 24 hr tablet Take 1 tablet (50 mg total) by mouth daily. Take with or immediately following a meal. 90 tablet 3  . nitroGLYCERIN (NITROSTAT) 0.4 MG SL tablet Place 1 tablet (0.4 mg total) under the tongue every 5 (five) minutes as needed for chest pain. 50 tablet 3  . omeprazole (PRILOSEC) 20 MG capsule Take 1 capsule (20 mg total) by mouth daily. 90 capsule 3  . Pitavastatin Calcium 4 MG TABS Take 1 tablet (4 mg total) by mouth daily. 90 tablet 3  . prasugrel (EFFIENT) 10 MG TABS tablet Take 1 tablet (10 mg total) by mouth daily. 90 tablet 3  . temazepam (RESTORIL) 30 MG capsule TAKE ONE CAPSULE BY MOUTH EVERY NIGHT AT BEDTIME AS NEEDED FOR SLEEP 30 capsule 0  . azithromycin (ZITHROMAX) 250 MG tablet Take 1 tablet (250 mg total) by mouth daily. Take first 2 tablets together, then 1  every day until finished. 6 tablet 0  . predniSONE (DELTASONE) 10 MG tablet Take 3 tablets (30 mg total) by mouth daily with breakfast. 15 tablet 0   No current facility-administered medications for this visit.    Allergies  Allergen Reactions  . Crestor [Rosuvastatin]     Myalgia    . Lipitor [Atorvastatin]     Myalgias  . Statins     Joint pain; pt states his body "locked up"    Health Maintenance Health Maintenance  Topic Date Due  . HIV Screening  09/02/2018 (Originally 01/05/1975)  . COLONOSCOPY  01/05/2023 (Originally 01/06/2008)  . TETANUS/TDAP   12/06/2018  . INFLUENZA VACCINE  Completed  . Hepatitis C Screening  Completed     Exam:  BP 122/77   Pulse 71   Temp 97.7 F (36.5 C) (Oral)   Ht 5\' 8"  (1.727 m)   Wt 214 lb (97.1 kg)   BMI 32.54 kg/m  Gen: Well NAD HEENT: EOMI,  MMM clear nasal discharge.  Normal tympanic membranes. Lungs: Normal work of breathing.  Coarse breath sounds right clear left.  Wheezing bilaterally. Heart: RRR no MRG Abd: NABS, Soft. Nondistended, Nontender Exts: Brisk capillary refill, warm and well perfused.  Mild antalgic gait painful left foot.  No results found for this or any previous visit (from the past 72 hour(s)). No results found.    Assessment and Plan: 58 y.o. male with  Bronchitis versus COPD exacerbation causing cough.  Plan to treat with albuterol prednisone and azithromycin.  Continue over-the-counter cough suppression medications.  Patient declined Law[employer] as they have not been helpful in the past.  Continue leftover codeine cough syrup.  Refill as needed.  Plantar fasciitis: Improved right foot following injection continued left foot pain.  It is too early for repeat injection.  Continue home exercise program.  Hopefully the oral prednisone may benefit.  If not recheck as needed. No orders of the defined types were placed in this encounter.  Meds ordered this encounter  Medications  . albuterol (PROVENTIL HFA;VENTOLIN HFA) 108 (90 Base) MCG/ACT inhaler    Sig: Inhale 2 puffs into the lungs every 6 (six) hours as needed for wheezing or shortness of breath.    Dispense:  1 Inhaler    Refill:  3  . predniSONE (DELTASONE) 10 MG tablet    Sig: Take 3 tablets (30 mg total) by mouth daily with breakfast.    Dispense:  15 tablet    Refill:  0  . azithromycin (ZITHROMAX) 250 MG tablet    Sig: Take 1 tablet (250 mg total) by mouth daily. Take first 2 tablets together, then 1 every day until finished.    Dispense:  6 tablet    Refill:  0     Discussed warning signs or  symptoms. Please see discharge instructions. Patient expresses understanding.

## 2017-02-28 NOTE — Patient Instructions (Signed)
Thank you for coming in today. Take the prednisone and Zpack Use albuterol as needed.  Use the over the counter medicine  Use left over cough medicine at night as needed. Recheck as needed.   Call or go to the emergency room if you get worse, have trouble breathing, have chest pains, or palpitations.     Acute Bronchitis, Adult Acute bronchitis is when air tubes (bronchi) in the lungs suddenly get swollen. The condition can make it hard to breathe. It can also cause these symptoms:  A cough.  Coughing up clear, yellow, or green mucus.  Wheezing.  Chest congestion.  Shortness of breath.  A fever.  Body aches.  Chills.  A sore throat.  Follow these instructions at home: Medicines  Take over-the-counter and prescription medicines only as told by your doctor.  If you were prescribed an antibiotic medicine, take it as told by your doctor. Do not stop taking the antibiotic even if you start to feel better. General instructions  Rest.  Drink enough fluids to keep your pee (urine) clear or pale yellow.  Avoid smoking and secondhand smoke. If you smoke and you need help quitting, ask your doctor. Quitting will help your lungs heal faster.  Use an inhaler, cool mist vaporizer, or humidifier as told by your doctor.  Keep all follow-up visits as told by your doctor. This is important. How is this prevented? To lower your risk of getting this condition again:  Wash your hands often with soap and water. If you cannot use soap and water, use hand sanitizer.  Avoid contact with people who have cold symptoms.  Try not to touch your hands to your mouth, nose, or eyes.  Make sure to get the flu shot every year.  Contact a doctor if:  Your symptoms do not get better in 2 weeks. Get help right away if:  You cough up blood.  You have chest pain.  You have very bad shortness of breath.  You become dehydrated.  You faint (pass out) or keep feeling like you are going  to pass out.  You keep throwing up (vomiting).  You have a very bad headache.  Your fever or chills gets worse. This information is not intended to replace advice given to you by your health care provider. Make sure you discuss any questions you have with your health care provider. Document Released: 06/09/2007 Document Revised: 07/30/2015 Document Reviewed: 06/11/2015 Elsevier Interactive Patient Education  [employer]2018 Elsevier Inc.

## 2017-03-30 DIAGNOSIS — E785 Hyperlipidemia, unspecified: Secondary | ICD-10-CM | POA: Diagnosis not present

## 2017-03-30 DIAGNOSIS — I519 Heart disease, unspecified: Secondary | ICD-10-CM | POA: Diagnosis not present

## 2017-03-30 DIAGNOSIS — Z720 Tobacco use: Secondary | ICD-10-CM | POA: Diagnosis not present

## 2017-03-30 DIAGNOSIS — I2510 Atherosclerotic heart disease of native coronary artery without angina pectoris: Secondary | ICD-10-CM | POA: Diagnosis not present

## 2017-03-30 DIAGNOSIS — I10 Essential (primary) hypertension: Secondary | ICD-10-CM | POA: Diagnosis not present

## 2017-04-05 ENCOUNTER — Encounter: Payer: Self-pay | Admitting: Family Medicine

## 2017-04-05 DIAGNOSIS — I5189 Other ill-defined heart diseases: Secondary | ICD-10-CM | POA: Insufficient documentation

## 2017-05-17 ENCOUNTER — Other Ambulatory Visit: Payer: Self-pay | Admitting: Family Medicine

## 2017-05-17 ENCOUNTER — Ambulatory Visit: Payer: BLUE CROSS/BLUE SHIELD | Admitting: Family Medicine

## 2017-05-17 ENCOUNTER — Encounter: Payer: Self-pay | Admitting: Family Medicine

## 2017-05-17 VITALS — BP 131/66 | HR 87 | Temp 98.2°F | Ht 68.0 in | Wt 212.0 lb

## 2017-05-17 DIAGNOSIS — M25859 Other specified joint disorders, unspecified hip: Secondary | ICD-10-CM

## 2017-05-17 DIAGNOSIS — R51 Headache: Secondary | ICD-10-CM

## 2017-05-17 DIAGNOSIS — M5442 Lumbago with sciatica, left side: Secondary | ICD-10-CM

## 2017-05-17 DIAGNOSIS — R519 Headache, unspecified: Secondary | ICD-10-CM

## 2017-05-17 DIAGNOSIS — R52 Pain, unspecified: Secondary | ICD-10-CM

## 2017-05-17 DIAGNOSIS — M25552 Pain in left hip: Secondary | ICD-10-CM

## 2017-05-17 DIAGNOSIS — R062 Wheezing: Secondary | ICD-10-CM

## 2017-05-17 MED ORDER — PREDNISONE 10 MG PO TABS: 30.0000 mg | ORAL_TABLET | Freq: Every day | ORAL | 0 refills | Status: DC

## 2017-05-17 MED ORDER — DOXYCYCLINE HYCLATE 100 MG PO TABS: 100.0000 mg | ORAL_TABLET | Freq: Two times a day (BID) | ORAL | 0 refills | Status: DC

## 2017-05-17 MED ORDER — PRASUGREL HCL 10 MG PO TABS: 10.0000 mg | ORAL_TABLET | Freq: Every day | ORAL | 3 refills | Status: DC

## 2017-05-17 NOTE — Progress Notes (Signed)
Vincent Wall is a 57 y.o. male who presents to [name]: [facility] today for headache, body aches, runny nose subjective fever.  Additionally patient notes wheezing.  Developed a headache one day ago.  He notes a bandlike associated with photophobia.  He notes that he does not typically have migraine headaches.  He denies a thunderclap or sudden onset of headache.  Tried over-the-counter medications which helped a bit.  He denies vomiting or diarrhea.   Additionally around the same time he notes body aches and subjective fevers.  He denies any rash or known tick bites but does work outside and did play golf recently.  He does have some sick contacts at home.  He denies chest pain or palpitations.  Lastly he notes some wheezing and congestion.  He notes this is typical for bronchitis or exacerbation of possible COPD.  He has not used much treatment for this yet.   Past Medical History:  Diagnosis Date  . Heart attack (HCC)   . Heart disease   . Hyperlipidemia   . Hypertension    Past Surgical History:  Procedure Laterality Date  . CORONARY STENT PLACEMENT  05/2010  . ELBOW SURGERY  2010   Social History   Tobacco Use  . Smoking status: Current Every Day Smoker    Packs/day: 1.00    Years: 20.00    Pack years: 20.00    Types: Cigarettes  . Smokeless tobacco: Never Used  Substance Use Topics  . Alcohol use: Yes    Alcohol/week: 0.5 oz    Types: 1 Standard drinks or equivalent per week   family history includes Breast cancer in his unknown relative; Diabetes in his unknown relative; Heart attack in his unknown relative; Hyperlipidemia in his unknown relative; Hypertension in his unknown relative.  ROS as above:  Medications: Current Outpatient Medications  Medication Sig Dispense Refill  . albuterol (PROVENTIL HFA;VENTOLIN HFA) 108 (90 Base) MCG/ACT inhaler Inhale  2 puffs into the lungs every 6 (six) hours as needed for wheezing or shortness of breath. 1 Inhaler 3  . aspirin 81 MG tablet Take 81 mg by mouth daily.    Marland Kitchen lisinopril-hydrochlorothiazide (PRINZIDE,ZESTORETIC) 20-12.5 MG tablet Take 1 tablet by mouth daily. 90 tablet 3  . metoprolol succinate (TOPROL-XL) 50 MG 24 hr tablet Take 1 tablet (50 mg total) by mouth daily. Take with or immediately following a meal. 90 tablet 3  . nitroGLYCERIN (NITROSTAT) 0.4 MG SL tablet Place 1 tablet (0.4 mg total) under the tongue every 5 (five) minutes as needed for chest pain. 50 tablet 3  . omeprazole (PRILOSEC) 20 MG capsule Take 1 capsule (20 mg total) by mouth daily. 90 capsule 3  . Pitavastatin Calcium 4 MG TABS Take 1 tablet (4 mg total) by mouth daily. 90 tablet 3  . prasugrel (EFFIENT) 10 MG TABS tablet Take 1 tablet (10 mg total) by mouth daily. 90 tablet 3  . predniSONE (DELTASONE) 10 MG tablet Take 3 tablets (30 mg total) by mouth daily with breakfast. 15 tablet 0  . temazepam (RESTORIL) 30 MG capsule TAKE ONE CAPSULE BY MOUTH EVERY NIGHT AT BEDTIME AS NEEDED FOR SLEEP 30 capsule 0  . doxycycline (VIBRA-TABS) 100 MG tablet Take 1 tablet (100 mg total) by mouth 2 (two) times daily. 14 tablet 0  . gabapentin (NEURONTIN) 300 MG capsule TAKE 1 CAPSULE DAILY X 7 DAYS, THEN 2 X 7 DAYS, THEN 3 DAILY MAY DOUBLE WEEKLY  TO MAX 3600 MG DAILY 180 capsule 0  . prasugrel (EFFIENT) 10 MG TABS tablet TAKE 1 TABLET(10 MG) BY MOUTH DAILY 90 tablet 0   No current facility-administered medications for this visit.    Allergies  Allergen Reactions  . Crestor [Rosuvastatin]     Myalgia    . Lipitor [Atorvastatin]     Myalgias  . Statins     Joint pain; pt states his body "locked up"    Health Maintenance Health Maintenance  Topic Date Due  . HIV Screening  09/02/2018 (Originally 01/05/1975)  . COLONOSCOPY  01/05/2023 (Originally 01/06/2008)  . INFLUENZA VACCINE  08/04/2017  . TETANUS/TDAP  12/06/2018  . Hepatitis  C Screening  Completed     Exam:  BP 131/66   Pulse 87   Temp 98.2 F (36.8 C) (Oral)   Ht  (1.727 m)   Wt 212 lb (96.2 kg)   SpO2 98%   BMI 32.23 kg/m  Gen: Well NAD nontoxic appearing HEENT: EOMI,  MMM steer pharynx mildly erythematous normal tympanic membranes bilaterally.  Mild cervical lymphadenopathy bilaterally Lungs: Normal work of breathing.  Slight wheezing and prolonged expiratory phase present bilaterally Heart: RRR no MRG Abd: NABS, Soft. Nondistended, Nontender Exts: Brisk capillary refill, warm and well perfused.  C-spine: No images months Skin: No large rash Neuro: Alert and oriented normal coordination balance and gait.  No results found for this or any previous visit (from the past 72 hour(s)). No results found.    Assessment and Plan: 57 y.o. male with headache, subjective fevers, and body aches.  This is concerning for possible [facility] spotted fever.  Patient denies any known tick contacts or rash however.  Risk of delaying treatment of [place] spotted fever is high enough to treat empirically with doxycycline in this case is reasonable although I think the most likely diagnosis is probably viral URI.  She does also have signs of early COPD exacerbation or bronchitis.  Will treat with prednisone.  Use home albuterol as needed.   Recheck in the near future if not improving or if worsening.  Effient refilled. No orders of the defined types were placed in this encounter.  Meds ordered this encounter  Medications  . doxycycline (VIBRA-TABS) 100 MG tablet    Sig: Take 1 tablet (100 mg total) by mouth 2 (two) times daily.    Dispense:  14 tablet    Refill:  0  . predniSONE (DELTASONE) 10 MG tablet    Sig: Take 3 tablets (30 mg total) by mouth daily with breakfast.    Dispense:  15 tablet    Refill:  0  . prasugrel (EFFIENT) 10 MG TABS tablet    Sig: Take 1 tablet (10 mg total) by mouth daily.    Dispense:  90 tablet    Refill:  3      Discussed warning signs or symptoms. Please see discharge instructions. Patient expresses understanding.

## 2017-05-17 NOTE — Patient Instructions (Signed)
Thank you for coming in today. Take the prednisone and doxycycline  Recheck as needed.   Call or go to the emergency room if you get worse, have trouble breathing, have chest pains, or palpitations.    Sinusitis, Adult Sinusitis is soreness and inflammation of your sinuses. Sinuses are hollow spaces in the bones around your face. They are located:  Around your eyes.  In the middle of your forehead.  Behind your nose.  In your cheekbones.  Your sinuses and nasal passages are lined with a stringy fluid (mucus). Mucus normally drains out of your sinuses. When your nasal tissues get inflamed or swollen, the mucus can get trapped or blocked so air cannot flow through your sinuses. This lets bacteria, viruses, and funguses grow, and that leads to infection. Follow these instructions at home: Medicines  Take, use, or apply over-the-counter and prescription medicines only as told by your doctor. These may include nasal sprays.  If you were prescribed an antibiotic medicine, take it as told by your doctor. Do not stop taking the antibiotic even if you start to feel better. Hydrate and Humidify  Drink enough water to keep your pee (urine) clear or pale yellow.  Use a cool mist humidifier to keep the humidity level in your home above 50%.  Breathe in steam for 10-15 minutes, 3-4 times a day or as told by your doctor. You can do this in the bathroom while a hot shower is running.  Try not to spend time in cool or dry air. Rest  Rest as much as possible.  Sleep with your head raised (elevated).  Make sure to get enough sleep each night. General instructions  Put a warm, moist washcloth on your face 3-4 times a day or as told by your doctor. This will help with discomfort.  Wash your hands often with soap and water. If there is no soap and water, use hand sanitizer.  Do not smoke. Avoid being around people who are smoking (secondhand smoke).  Keep all follow-up visits as told by  your doctor. This is important. Contact a doctor if:  You have a fever.  Your symptoms get worse.  Your symptoms do not get better within 10 days. Get help right away if:  You have a very bad headache.  You cannot stop throwing up (vomiting).  You have pain or swelling around your face or eyes.  You have trouble seeing.  You feel confused.  Your neck is stiff.  You have trouble breathing. This information is not intended to replace advice given to you by your health care provider. Make sure you discuss any questions you have with your health care provider. Document Released: 06/09/2007 Document Revised: 08/17/2015 Document Reviewed: 10/16/2014 Elsevier Interactive Patient Education  2018 Elsevier Inc.   [facility] Spotted Fever [facility] spotted fever is an illness that is spread to people by infected ticks. The illness causes flulike symptoms and a reddish-purple rash. This illness can quickly become very serious. Treatment must be started right away. When the illness is not treated right away, it can sometimes lead to long-term health problems or even death. This illness is most common during warm weather when ticks are most active. What are the causes? [facility] spotted fever is caused by a type of bacteria that is called Rickettsia rickettsii. This type of bacteria is carried by [place] dog ticks and [employer]. People get infected through a bite from a tick that is infected with the  bacteria. The bite is painless, and it frequently goes unnoticed. The bacteria can also infect a person when tick blood or tick feces get into a person's body through damaged skin. A tick bite is not necessary for an infection to occur. People can get [facility] spotted fever if they get a tick's blood or body fluids on their skin in the area of a small cut or sore. This could happen while removing a tick from another person or a dog. The infection is not  contagious, and it cannot be spread (transmitted) from person to person. What are the signs or symptoms? Symptoms may begin 2-14 days after a tick bite. The most common early symptoms are:  Fever.  Muscle aches.  Headache.  Nausea.  Vomiting.  Poor appetite.  Abdominal pain.  The reddish-purple rash usually appears 3-5 days after the first symptoms begin. The rash often starts on the wrists and ankles. It may then spread to the palms, the soles of the feet, the legs, and the trunk. How is this diagnosed? Diagnosis is based on a physical exam, medical history, and blood tests. Your health care provider may suspect [facility] spotted fever in one of these cases:  If you have recently been bitten by a tick.  If you have been in areas that have a lot of ticks or in areas where the disease is common.  How is this treated? It is important to begin treatment right away. Treatment will usually involve the use of antibiotic medicines. In some cases, your health care provider may begin treatment before the diagnosis is confirmed. If your symptoms are severe, a hospital stay may be needed. Follow these instructions at home:  Rest as much as possible until you feel better.  Take medicines only as directed by your health care provider.  Take your antibiotic medicine as directed by your health care provider. Finish the antibiotic even if you start to feel better.  Drink enough fluid to keep your urine clear or pale yellow.  Keep all follow-up visits as directed by your health care provider. This is important. How is this prevented? Avoiding tick bites can help to prevent this illness. Take these steps to avoid tick bites when you are outdoors:  Be aware that most ticks live in shrubs, low tree branches, and grassy areas. A tick can climb onto your body when you make contact with leaves or grass where the tick is waiting.  Wear protective clothing. Long sleeves and long pants are  best.  Wear white clothes so you can see ticks more easily.  Tuck your pant legs into your socks.  If you go walking on a trail, stay in the middle of the trail to avoid brushing against bushes.  Avoid walking through areas that have long grass.  Put insect repellent on all exposed skin and along boot tops, pant legs, and sleeve cuffs.  Check clothing, hair, and skin repeatedly and before going inside.  Check family members and pets for ticks.  Brush off any ticks that are not attached.  Take a shower or a bath as soon as possible after you have been outdoors. Check your skin for ticks. The most common places on the body where ticks attach themselves are the scalp, neck, armpits, waist, and groin.  You can also greatly reduce your chances of getting [facility] spotted fever if you remove attached ticks as soon as possible. To remove an attached tick, use a forceps or fine-point tweezers to  detach the intact tick without leaving its mouth parts in the skin. The wound from the tick bite should be washed after the tick has been removed. Contact a health care provider if:  You have drainage, swelling, or increased redness or pain in the area of the rash. Get help right away if:  You have chest pain.  You have shortness of breath.  You have a severe headache.  You have a seizure.  You have severe abdominal pain.  You are feeling confused.  You are bruising easily.  You have bleeding from your gums.  You have blood in your stool. This information is not intended to replace advice given to you by your health care provider. Make sure you discuss any questions you have with your health care provider. Document Released: 04/04/2000 Document Revised: 05/29/2015 Document Reviewed: 08/06/2013 Elsevier Interactive Patient Education  2018 [employer].

## 2017-05-30 ENCOUNTER — Other Ambulatory Visit: Payer: Self-pay | Admitting: Family Medicine

## 2017-05-30 DIAGNOSIS — M25859 Other specified joint disorders, unspecified hip: Secondary | ICD-10-CM

## 2017-05-30 DIAGNOSIS — M25552 Pain in left hip: Secondary | ICD-10-CM

## 2017-05-30 DIAGNOSIS — M5442 Lumbago with sciatica, left side: Secondary | ICD-10-CM

## 2017-06-20 ENCOUNTER — Ambulatory Visit: Payer: BLUE CROSS/BLUE SHIELD | Admitting: Family Medicine

## 2017-06-20 ENCOUNTER — Encounter: Payer: Self-pay | Admitting: Family Medicine

## 2017-06-20 VITALS — BP 112/74 | HR 82 | Wt 212.0 lb

## 2017-06-20 DIAGNOSIS — M25552 Pain in left hip: Secondary | ICD-10-CM | POA: Diagnosis not present

## 2017-06-20 DIAGNOSIS — R7303 Prediabetes: Secondary | ICD-10-CM

## 2017-06-20 DIAGNOSIS — M25859 Other specified joint disorders, unspecified hip: Secondary | ICD-10-CM

## 2017-06-20 DIAGNOSIS — I700 Atherosclerosis of aorta: Secondary | ICD-10-CM

## 2017-06-20 DIAGNOSIS — M722 Plantar fascial fibromatosis: Secondary | ICD-10-CM | POA: Diagnosis not present

## 2017-06-20 DIAGNOSIS — I10 Essential (primary) hypertension: Secondary | ICD-10-CM

## 2017-06-20 DIAGNOSIS — Z6832 Body mass index (BMI) 32.0-32.9, adult: Secondary | ICD-10-CM | POA: Diagnosis not present

## 2017-06-20 DIAGNOSIS — G4733 Obstructive sleep apnea (adult) (pediatric): Secondary | ICD-10-CM | POA: Diagnosis not present

## 2017-06-20 DIAGNOSIS — M5442 Lumbago with sciatica, left side: Secondary | ICD-10-CM

## 2017-06-20 DIAGNOSIS — E782 Mixed hyperlipidemia: Secondary | ICD-10-CM

## 2017-06-20 MED ORDER — PITAVASTATIN CALCIUM 4 MG PO TABS: 1.0000 | ORAL_TABLET | Freq: Every day | ORAL | 3 refills | Status: DC

## 2017-06-20 MED ORDER — PRASUGREL HCL 10 MG PO TABS: 10.0000 mg | ORAL_TABLET | Freq: Every day | ORAL | 3 refills | Status: DC

## 2017-06-20 MED ORDER — TEMAZEPAM 30 MG PO CAPS: ORAL_CAPSULE | ORAL | 0 refills | Status: DC

## 2017-06-20 MED ORDER — LISINOPRIL-HYDROCHLOROTHIAZIDE 20-12.5 MG PO TABS: 1.0000 | ORAL_TABLET | Freq: Every day | ORAL | 3 refills | Status: DC

## 2017-06-20 MED ORDER — METOPROLOL SUCCINATE ER 50 MG PO TB24: 50.0000 mg | ORAL_TABLET | Freq: Every day | ORAL | 3 refills | Status: DC

## 2017-06-20 MED ORDER — GABAPENTIN 300 MG PO CAPS: ORAL_CAPSULE | ORAL | 0 refills | Status: DC

## 2017-06-20 MED ORDER — HYDROCODONE-ACETAMINOPHEN 10-325 MG PO TABS: 1.0000 | ORAL_TABLET | Freq: Three times a day (TID) | ORAL | 0 refills | Status: DC | PRN

## 2017-06-20 MED ORDER — NITROGLYCERIN 0.4 MG SL SUBL: 0.4000 mg | SUBLINGUAL_TABLET | SUBLINGUAL | 3 refills | Status: DC | PRN

## 2017-06-20 MED ORDER — OMEPRAZOLE 20 MG PO CPDR: 20.0000 mg | DELAYED_RELEASE_CAPSULE | Freq: Every day | ORAL | 3 refills | Status: DC

## 2017-06-20 NOTE — Patient Instructions (Signed)
Thank you for coming in today.' Get labs now.  Recheck in 6 months or sooner if needed.  Call or go to the ER if you develop a large red swollen joint with extreme pain or oozing puss.   Let me know how the foot is doing.

## 2017-06-20 NOTE — Progress Notes (Signed)
Vincent Wall is a 57 y.o. male who presents to [name]: Primary Care Sports Medicine today for foot pain.  Vincent Wall has a history of bilateral plantar fasciitis.  He last received injections in February 2019.  This worked quite well for his right foot but not his left.  He would like repeat injection today if possible.  He is to use orthotics and continues his home exercise program.  He notes pain is moderate to severe and worse with prolonged standing and when he gets out of bed in the morning. He would like repeat injection if able today.   Additionally he is here today to follow up HTH, HLD, chronic pain and Prediabetes.  He takes the medication below and tolerates then well with no significant muscle aches and pain. He is drinking sweet tea daily and is not sure if he can give them up.   Chronic pain: Vincent Wall has chronic lumbago with radiculopathy and foot pain.  He uses gabapentin and very occasionally hydrocodone.  He notes the pain allows him to function work quite well.  He denies significant adverse side effects.  Hypertension: Patient takes medications listed below and does well  Hyperlipidemia: Patient takes Livalo and has trouble tolerating conventional statins.  He tolerates Livalo well with no muscle aches or pains.   ROS as above:  Exam:  BP 112/74   Pulse 82   Wt 212 lb (96.2 kg)   BMI 32.23 kg/m  Gen: Well NAD HEENT: EOMI,  MMM Lungs: Normal work of breathing. CTABL Heart: RRR no MRG Abd: NABS, Soft. Nondistended, Nontender Exts: Brisk capillary refill, warm and well perfused.  Left foot: Cavus foot type with no other deformity. TTP medial plantar calcaneous. Normal motion, pulses, cap refill and sensation.   Procedure: Real-time Ultrasound Guided Injection of left plantar fasciitis  Device: GE Logiq E   Images permanently stored and available for review in the ultrasound  unit. Verbal informed consent obtained.  Discussed risks and benefits of procedure. Warned about infection bleeding damage to structures skin hypopigmentation and fat atrophy among others. Patient expresses understanding and agreement Time-out conducted.   Noted no overlying erythema, induration, or other signs of local infection.   Skin prepped in a sterile fashion.   Local anesthesia: Topical Ethyl chloride.   With sterile technique and under real time ultrasound guidance:  40mg  kenalog and 2ml marcaine injected easily.   Completed without difficulty   Pain immediately resolved suggesting accurate placement of the medication.   Advised to call if fevers/chills, erythema, induration, drainage, or persistent bleeding.   Images permanently stored and available for review in the ultrasound unit.  Impression: Technically successful ultrasound guided injection.       Assessment and Plan: 57 y.o. male with  Plantar fasciitis: S/p injection today. Continue conservative management with stretching and orthotics. Recheck PRN.   Chronic Pain: Continue current regemin. Very infrequent norco. Warned against norco and temazepam at the same time.  Patient researched [facility] Controlled Substance Reporting System.  HTN: BP controlled. Continue treatment. Check CMP/lipids.   HLD: Check lipid panel anticipate continue livalo. This will reduce risk of progression of arthrosclerosis of aorta.   PrediabetesObesity: Check A1c. Avoid sugar sweetened beverages.   OSA: Continue CPAP.     Orders Placed This Encounter  Procedures  . CBC  . COMPLETE METABOLIC PANEL WITH GFR  . Lipid Panel w/reflex Direct LDL  . Hemoglobin A1c   Meds ordered this encounter  Medications  . temazepam (RESTORIL) 30 MG capsule    Sig: TAKE ONE CAPSULE BY MOUTH EVERY NIGHT AT BEDTIME AS NEEDED FOR SLEEP    Dispense:  30 capsule    Refill:  0  . prasugrel (EFFIENT) 10 MG TABS tablet    Sig: Take 1 tablet (10  mg total) by mouth daily.    Dispense:  90 tablet    Refill:  3  . Pitavastatin Calcium 4 MG TABS    Sig: Take 1 tablet (4 mg total) by mouth daily.    Dispense:  90 tablet    Refill:  3    Failed prior statin with intolerable side effects.  Marland Kitchen omeprazole (PRILOSEC) 20 MG capsule    Sig: Take 1 capsule (20 mg total) by mouth daily.    Dispense:  90 capsule    Refill:  3  . nitroGLYCERIN (NITROSTAT) 0.4 MG SL tablet    Sig: Place 1 tablet (0.4 mg total) under the tongue every 5 (five) minutes as needed for chest pain.    Dispense:  50 tablet    Refill:  3  . metoprolol succinate (TOPROL-XL) 50 MG 24 hr tablet    Sig: Take 1 tablet (50 mg total) by mouth daily. Take with or immediately following a meal.    Dispense:  90 tablet    Refill:  3  . lisinopril-hydrochlorothiazide (PRINZIDE,ZESTORETIC) 20-12.5 MG tablet    Sig: Take 1 tablet by mouth daily.    Dispense:  90 tablet    Refill:  3  . gabapentin (NEURONTIN) 300 MG capsule    Sig: SEE NOTES    Dispense:  180 capsule    Refill:  0    TAKE ONE CAPSULE BY MOUTH DAILY FOR 7 DAYS, THEN 2 CAPSULES DAILY FOR 7 DAYS, THEN 3 CAPSULES DAILY. MAY DOUBLE WEEKLY TO A MAX OF 3600 MG DAILY  . HYDROcodone-acetaminophen (NORCO) 10-325 MG tablet    Sig: Take 1-2 tablets by mouth every 8 (eight) hours as needed.    Dispense:  30 tablet    Refill:  0     Historical information moved to improve visibility of documentation.  Past Medical History:  Diagnosis Date  . Heart attack (HCC)   . Heart disease   . Hyperlipidemia   . Hypertension    Past Surgical History:  Procedure Laterality Date  . CORONARY STENT PLACEMENT  05/2010  . ELBOW SURGERY  2010   Social History   Tobacco Use  . Smoking status: Current Every Day Smoker    Packs/day: 1.00    Years: 20.00    Pack years: 20.00    Types: Cigarettes  . Smokeless tobacco: Never Used  Substance Use Topics  . Alcohol use: Yes    Alcohol/week: 0.6 oz    Types: 1 Standard drinks or  equivalent per week   family history includes Breast cancer in his unknown relative; Diabetes in his unknown relative; Heart attack in his unknown relative; Hyperlipidemia in his unknown relative; Hypertension in his unknown relative.  Medications: Current Outpatient Medications  Medication Sig Dispense Refill  . albuterol (PROVENTIL HFA;VENTOLIN HFA) 108 (90 Base) MCG/ACT inhaler Inhale 2 puffs into the lungs every 6 (six) hours as needed for wheezing or shortness of breath. 1 Inhaler 3  . aspirin 81 MG tablet Take 81 mg by mouth daily.    Marland Kitchen gabapentin (NEURONTIN) 300 MG capsule SEE NOTES 180 capsule 0  . lisinopril-hydrochlorothiazide (PRINZIDE,ZESTORETIC) 20-12.5 MG tablet Take 1 tablet by  mouth daily. 90 tablet 3  . metoprolol succinate (TOPROL-XL) 50 MG 24 hr tablet Take 1 tablet (50 mg total) by mouth daily. Take with or immediately following a meal. 90 tablet 3  . nitroGLYCERIN (NITROSTAT) 0.4 MG SL tablet Place 1 tablet (0.4 mg total) under the tongue every 5 (five) minutes as needed for chest pain. 50 tablet 3  . omeprazole (PRILOSEC) 20 MG capsule Take 1 capsule (20 mg total) by mouth daily. 90 capsule 3  . Pitavastatin Calcium 4 MG TABS Take 1 tablet (4 mg total) by mouth daily. 90 tablet 3  . prasugrel (EFFIENT) 10 MG TABS tablet Take 1 tablet (10 mg total) by mouth daily. 90 tablet 3  . temazepam (RESTORIL) 30 MG capsule TAKE ONE CAPSULE BY MOUTH EVERY NIGHT AT BEDTIME AS NEEDED FOR SLEEP 30 capsule 0  . HYDROcodone-acetaminophen (NORCO) 10-325 MG tablet Take 1-2 tablets by mouth every 8 (eight) hours as needed. 30 tablet 0   No current facility-administered medications for this visit.    Allergies  Allergen Reactions  . Crestor [Rosuvastatin]     Myalgia    . Lipitor [Atorvastatin]     Myalgias  . Statins     Joint pain; pt states his body "locked up"    Health Maintenance Health Maintenance  Topic Date Due  . HIV Screening  09/02/2018 (Originally 01/05/1975)  .  COLONOSCOPY  01/05/2023 (Originally 01/06/2008)  . INFLUENZA VACCINE  08/04/2017  . TETANUS/TDAP  12/06/2018  . Hepatitis C Screening  Completed    Discussed warning signs or symptoms. Please see discharge instructions. Patient expresses understanding.

## 2017-08-10 ENCOUNTER — Other Ambulatory Visit: Payer: Self-pay | Admitting: Family Medicine

## 2017-08-24 ENCOUNTER — Telehealth: Payer: Self-pay | Admitting: Family Medicine

## 2017-08-24 NOTE — Telephone Encounter (Signed)
I received a message from Vincent Wall's wife  She is worried as his behavior has changed and she is worried about the possibility of drug interaction or some other mental status change.  Plan: Contact Vincent Wall to inquire how he is doing and see if he would like a follow-up visit to discuss irritability. Send note back to wife asking her to get Vincent Wall to schedule an appointment for irritability and medication evaluation.  My chart note is listed below Dr. Kandee Keenory,  This is Vincent Wall the wife of one of your patients Vincent Wall. I know that you cannot give out any information as far as his med. records, etc. however I am pleading with you to please review his medications; he is getting to where he is flying off the handle and having rages; not being rational or wanting to listen and I thought maybe it may be coming from a combination of his medications. Please, do not let him know I have contacted you at all. I am just extremely concerned.  He had a similar reaction when he had taken the overdose of Temazepam accidentally thinking it was the Gabapentin. Once we were home his was in a very agitated state and he kicked the coffee table across the room. He is still taking this to sleep at night. The medications I see at home & know of are as follows.  Cyclobenzaprine, Metoprolol ER Succ., Lisinopol, Valo, Prasugrel Tablets, Temazem, Ranitidine 150 mg, Omerazole 20 mg, Hydrocodone / Acctaminophen 10-325 t, & Gabapentin 300mg .  TY

## 2017-08-26 DIAGNOSIS — F4323 Adjustment disorder with mixed anxiety and depressed mood: Secondary | ICD-10-CM | POA: Diagnosis not present

## 2017-08-30 ENCOUNTER — Ambulatory Visit: Payer: BLUE CROSS/BLUE SHIELD | Admitting: Family Medicine

## 2017-08-30 ENCOUNTER — Encounter: Payer: Self-pay | Admitting: Family Medicine

## 2017-08-30 VITALS — BP 132/57 | HR 69 | Ht 68.0 in | Wt 209.0 lb

## 2017-08-30 DIAGNOSIS — F5101 Primary insomnia: Secondary | ICD-10-CM | POA: Diagnosis not present

## 2017-08-30 DIAGNOSIS — G8929 Other chronic pain: Secondary | ICD-10-CM | POA: Diagnosis not present

## 2017-08-30 DIAGNOSIS — M545 Low back pain, unspecified: Secondary | ICD-10-CM

## 2017-08-30 DIAGNOSIS — M722 Plantar fascial fibromatosis: Secondary | ICD-10-CM

## 2017-08-30 MED ORDER — TEMAZEPAM 15 MG PO CAPS: 15.0000 mg | ORAL_CAPSULE | Freq: Every evening | ORAL | 5 refills | Status: DC | PRN

## 2017-08-30 MED ORDER — HYDROCODONE-ACETAMINOPHEN 10-325 MG PO TABS: 1.0000 | ORAL_TABLET | Freq: Three times a day (TID) | ORAL | 0 refills | Status: DC | PRN

## 2017-08-30 NOTE — Patient Instructions (Signed)
Thank you for coming in today. Try using 1/2 of temazepam nightly.  Be careful of combining the temazepam, gabapentin and Norco.   For the foot continue the exercises.  If not getting better we could consider trial of nitroglycerin patches. Continue the home exercises.   Call or go to the ER if you develop a large red swollen joint with extreme pain or oozing puss.   Get a flu shot this year.    Nitroglycerin Protocol   Apply 1/4 nitroglycerin patch to affected area daily.  Change position of patch within the affected area every 24 hours.  You may experience a headache during the first 1-2 weeks of using the patch, these should subside.  If you experience headaches after beginning nitroglycerin patch treatment, you may take your preferred over the counter pain reliever.  Another side effect of the nitroglycerin patch is skin irritation or rash related to patch adhesive.  Please notify our office if you develop more severe headaches or rash, and stop the patch.  Tendon healing with nitroglycerin patch may require 12 to 24 weeks depending on the extent of injury.  Men should not use if taking Viagra, Cialis, or Levitra.   Do not use if you have migraines or rosacea.

## 2017-08-30 NOTE — Telephone Encounter (Signed)
I have scheduled patient for today. I did not discuss with him anything about the message his wife left. He is wanting to come in for heel pain and refills on insomnia medications.

## 2017-08-31 ENCOUNTER — Encounter: Payer: Self-pay | Admitting: Family Medicine

## 2017-08-31 NOTE — Progress Notes (Signed)
Vincent Wall is a 57 y.o. male who presents to [name]: Primary Care [facility] Medicine today for evaluation of plantar fasciitis and discuss insomnia.[name] has a history of bilateral plantar fasciitis.  His left foot has been quite bothersome recently.  He was seen last in June where he received an injection which helped temporarily.  He notes that he has a golf trip coming up and is interested in repeat injection if possible.  He is doing home exercise program and using over-the-counter arch support and arch strap.  He has cushioned insoles as well.  He denies any radiating pain weakness or numbness.  Does lots of walking and notes that prolonged walking is painful.  Insomnia: Patient takes temazepam at bedtime for insomnia.  He will occasionally use gabapentin for radicular leg pain but not frequently.  He notes that he does not take temazepam when he takes gabapentin.  Additionally he uses hydrocodone for chronic pain intermittently and infrequently.  Notes that he does not have any problems with confusion or irritability with these medications.  He tolerates them quite well.  He does not drink alcohol.  Chronic pain: Given his chronic back pain and uses hydrocodone infrequently.  The hydrocodone allows him to function and work and complete normal activities of daily living.  He does not take the medication daily.   ROS as above:  Exam:  BP (!) 132/57   Pulse 69   Ht 5\' 8"  (1.727 m)   Wt 209 lb (94.8 kg)   BMI 31.78 kg/m  Wt Readings from Last 5 Encounters:  08/30/17 209 lb (94.8 kg)  06/20/17 212 lb (96.2 kg)  05/17/17 212 lb (96.2 kg)  02/28/17 214 lb (97.1 kg)  02/16/17 215 lb (97.5 kg)    Gen: Well NAD HEENT: EOMI,  MMM Lungs: Normal work of breathing. CTABL Heart: RRR no MRG Abd: NABS, Soft. Nondistended, Nontender Exts: Brisk capillary refill, warm and well perfused.  Left foot  largely normal appearing.  Tender palpation plantar calcaneus.  Normal foot motion. Psych alert and oriented normal speech thought process and affect.  Lab and Radiology Results Procedure: Real-time Ultrasound Guided Injection of left plantar fascia  Device: GE Logiq E   Images permanently stored and available for review in the ultrasound unit. Verbal informed consent obtained.  Discussed risks and benefits of procedure. Warned about infection bleeding damage to structures skin hypopigmentation and fat atrophy among others. Patient expresses understanding and agreement Time-out conducted.   Noted no overlying erythema, induration, or other signs of local infection.   Skin prepped in a sterile fashion.   Local anesthesia: Topical Ethyl chloride.   With sterile technique and under real time ultrasound guidance:  40mg  kenalog and 1ml marcaine injected easily.   Completed without difficulty   Pain ]immediately resolved suggesting accurate placement of the medication.   Advised to call if fevers/chills, erythema, induration, drainage, or persistent bleeding.   Images permanently stored and available for review in the ultrasound unit.  Impression: Technically successful ultrasound guided injection.     Assessment and Plan: 57 y.o. male with  Left plantar fasciitis.  Status post injection today.  Discussed that would like to avoid repeat injections in the future.  Discussed alternatives including the possibility of adding nitroglycerin patches.  If we just cannot get this better we also discussed some surgery options.  Continue home exercise program and over-the-counter treatments.  Insomnia: Patient has 3 medications that could potentially  interact with each other causing confusion or even delirium (temazepam, gabapentin, hydrocodone).  He is not taking them together regularly which should not be helpful.  Additionally discussed that temazepam in the long run will increase risk of falls and  confusion as he gets older.  We will try using 15 mg pills and prescribe enough that he can take 30 mg daily but try cutting back and see if he can get by on 15 mg as a way to potentially wean off of this medication in the future.  Patient denies significant side effects however.  Pain: Doing reasonably well.  Continue to use infrequent amounts of hydrocodone.  Recheck in the future as needed.  Patient researched [facility] Controlled Substance Reporting System.  No orders of the defined types were placed in this encounter.  Meds ordered this encounter  Medications  . temazepam (RESTORIL) 15 MG capsule    Sig: Take 1-2 capsules (15-30 mg total) by mouth at bedtime as needed for sleep.    Dispense:  60 capsule    Refill:  5  . HYDROcodone-acetaminophen (NORCO) 10-325 MG tablet    Sig: Take 1-2 tablets by mouth every 8 (eight) hours as needed.    Dispense:  30 tablet    Refill:  0     Historical information moved to improve visibility of documentation.  Past Medical History:  Diagnosis Date  . Heart attack (HCC)   . Heart disease   . Hyperlipidemia   . Hypertension    Past Surgical History:  Procedure Laterality Date  . CORONARY STENT PLACEMENT  05/2010  . ELBOW SURGERY  2010   Social History   Tobacco Use  . Smoking status: Current Every Day Smoker    Packs/day: 1.00    Years: 20.00    Pack years: 20.00    Types: Cigarettes  . Smokeless tobacco: Never Used  Substance Use Topics  . Alcohol use: Not Currently    Alcohol/week: 1.0 standard drinks    Types: 1 Standard drinks or equivalent per week   family history includes Breast cancer in his unknown relative; Diabetes in his unknown relative; Heart attack in his unknown relative; Hyperlipidemia in his unknown relative; Hypertension in his unknown relative.  Medications: Current Outpatient Medications  Medication Sig Dispense Refill  . albuterol (PROVENTIL HFA;VENTOLIN HFA) 108 (90 Base) MCG/ACT inhaler Inhale 2  puffs into the lungs every 6 (six) hours as needed for wheezing or shortness of breath. 1 Inhaler 3  . aspirin 81 MG tablet Take 81 mg by mouth daily.    Marland Kitchen gabapentin (NEURONTIN) 300 MG capsule SEE NOTES 180 capsule 0  . HYDROcodone-acetaminophen (NORCO) 10-325 MG tablet Take 1-2 tablets by mouth every 8 (eight) hours as needed. 30 tablet 0  . lisinopril-hydrochlorothiazide (PRINZIDE,ZESTORETIC) 20-12.5 MG tablet Take 1 tablet by mouth daily. 90 tablet 3  . metoprolol succinate (TOPROL-XL) 50 MG 24 hr tablet Take 1 tablet (50 mg total) by mouth daily. Take with or immediately following a meal. 90 tablet 3  . nitroGLYCERIN (NITROSTAT) 0.4 MG SL tablet Place 1 tablet (0.4 mg total) under the tongue every 5 (five) minutes as needed for chest pain. 50 tablet 3  . omeprazole (PRILOSEC) 20 MG capsule Take 1 capsule (20 mg total) by mouth daily. 90 capsule 3  . Pitavastatin Calcium 4 MG TABS Take 1 tablet (4 mg total) by mouth daily. 90 tablet 3  . prasugrel (EFFIENT) 10 MG TABS tablet Take 1 tablet (10 mg total) by mouth  daily. 90 tablet 3  . temazepam (RESTORIL) 15 MG capsule Take 1-2 capsules (15-30 mg total) by mouth at bedtime as needed for sleep. 60 capsule 5   No current facility-administered medications for this visit.    Allergies  Allergen Reactions  . Crestor [Rosuvastatin]     Myalgia    . Lipitor [Atorvastatin]     Myalgias  . Statins     Joint pain; pt states his body "locked up"     Discussed warning signs or symptoms. Please see discharge instructions. Patient expresses understanding.

## 2017-09-16 ENCOUNTER — Telehealth: Payer: Self-pay

## 2017-09-16 MED ORDER — NITROGLYCERIN 0.2 MG/HR TD PT24: MEDICATED_PATCH | TRANSDERMAL | 1 refills | Status: DC

## 2017-09-16 NOTE — Telephone Encounter (Signed)
Patient called stated that his plantar fascitis is not better and was told during his visit that if he was not getting better  Then could get a Rx for Nitroglycerin patch sent to his pharmacy. Patient is requesting a Rx. Please advise. Rhonda Cunningham,CMA

## 2017-09-16 NOTE — Telephone Encounter (Signed)
Nitroglycerin patches sent to pharmacy.

## 2017-09-16 NOTE — Telephone Encounter (Signed)
Patient advised of new Rx.  

## 2017-09-23 DIAGNOSIS — F4323 Adjustment disorder with mixed anxiety and depressed mood: Secondary | ICD-10-CM | POA: Diagnosis not present

## 2017-10-04 LAB — LIPID PANEL W/REFLEX DIRECT LDL
CHOLESTEROL: 161 mg/dL (ref <200)
HDL: 32 mg/dL — AB (ref >40)
LDL Cholesterol (Calc): 100 mg/dL (calc) — ABNORMAL HIGH
NON-HDL CHOLESTEROL (CALC): 129 mg/dL (ref <130)
Total CHOL/HDL Ratio: 5 (calc) — ABNORMAL HIGH (ref <5.0)
Triglycerides: 191 mg/dL — ABNORMAL HIGH (ref <150)

## 2017-10-04 LAB — COMPLETE METABOLIC PANEL WITH GFR
AG Ratio: 1.8 (calc) (ref 1.0–2.5)
ALBUMIN MSPROF: 4.1 g/dL (ref 3.6–5.1)
ALKALINE PHOSPHATASE (APISO): 75 U/L (ref 40–115)
ALT: 27 U/L (ref 9–46)
AST: 16 U/L (ref 10–35)
BUN: 22 mg/dL (ref 7–25)
CO2: 30 mmol/L (ref 20–32)
Calcium: 9.2 mg/dL (ref 8.6–10.3)
Chloride: 101 mmol/L (ref 98–110)
Creat: 1.22 mg/dL (ref 0.70–1.33)
GFR, Est African American: 75 mL/min/{1.73_m2} (ref >=60)
GFR, Est Non African American: 64 mL/min/{1.73_m2} (ref >=60)
GLOBULIN: 2.3 g/dL (ref 1.9–3.7)
Glucose, Bld: 132 mg/dL — ABNORMAL HIGH (ref 65–99)
POTASSIUM: 4.5 mmol/L (ref 3.5–5.3)
SODIUM: 137 mmol/L (ref 135–146)
Total Bilirubin: 1 mg/dL (ref 0.2–1.2)
Total Protein: 6.4 g/dL (ref 6.1–8.1)

## 2017-10-04 LAB — CBC
HEMATOCRIT: 44.9 % (ref 38.5–50.0)
Hemoglobin: 14.6 g/dL (ref 13.2–17.1)
MCH: 27.8 pg (ref 27.0–33.0)
MCHC: 32.5 g/dL (ref 32.0–36.0)
MCV: 85.5 fL (ref 80.0–100.0)
MPV: 10.3 fL (ref 7.5–12.5)
Platelets: 192 10*3/uL (ref 140–400)
RBC: 5.25 10*6/uL (ref 4.20–5.80)
RDW: 14.4 % (ref 11.0–15.0)
WBC: 7.7 10*3/uL (ref 3.8–10.8)

## 2017-10-04 LAB — HEMOGLOBIN A1C
EAG (MMOL/L): 6.6 (calc)
HEMOGLOBIN A1C: 5.8 %{Hb} — AB (ref <5.7)
Mean Plasma Glucose: 120 (calc)

## 2017-12-15 IMAGING — DX DG HIP (WITH OR WITHOUT PELVIS) 2-3V*L*
3 series · 3 of 3 positions shown · non-contrast
Comparison: None

CLINICAL DATA: LEFT lower back pain radiating to LEFT hip for 1-2
months, fell out of a chair 3 weeks ago

EXAM:
DG HIP (WITH OR WITHOUT PELVIS) 2-3V LEFT

[pelvis ap]
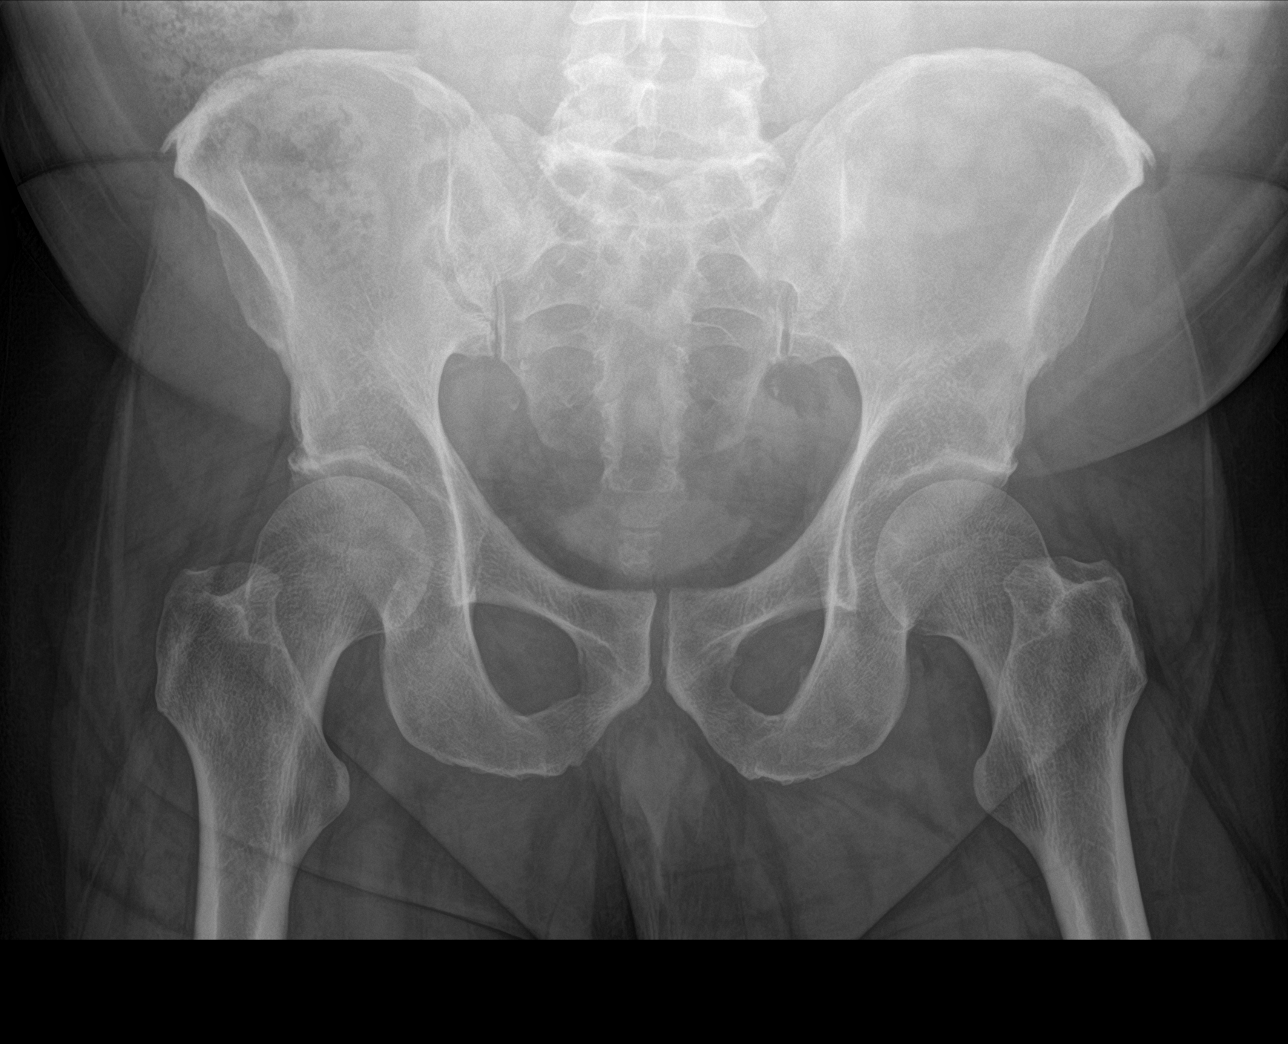

[hip ap]
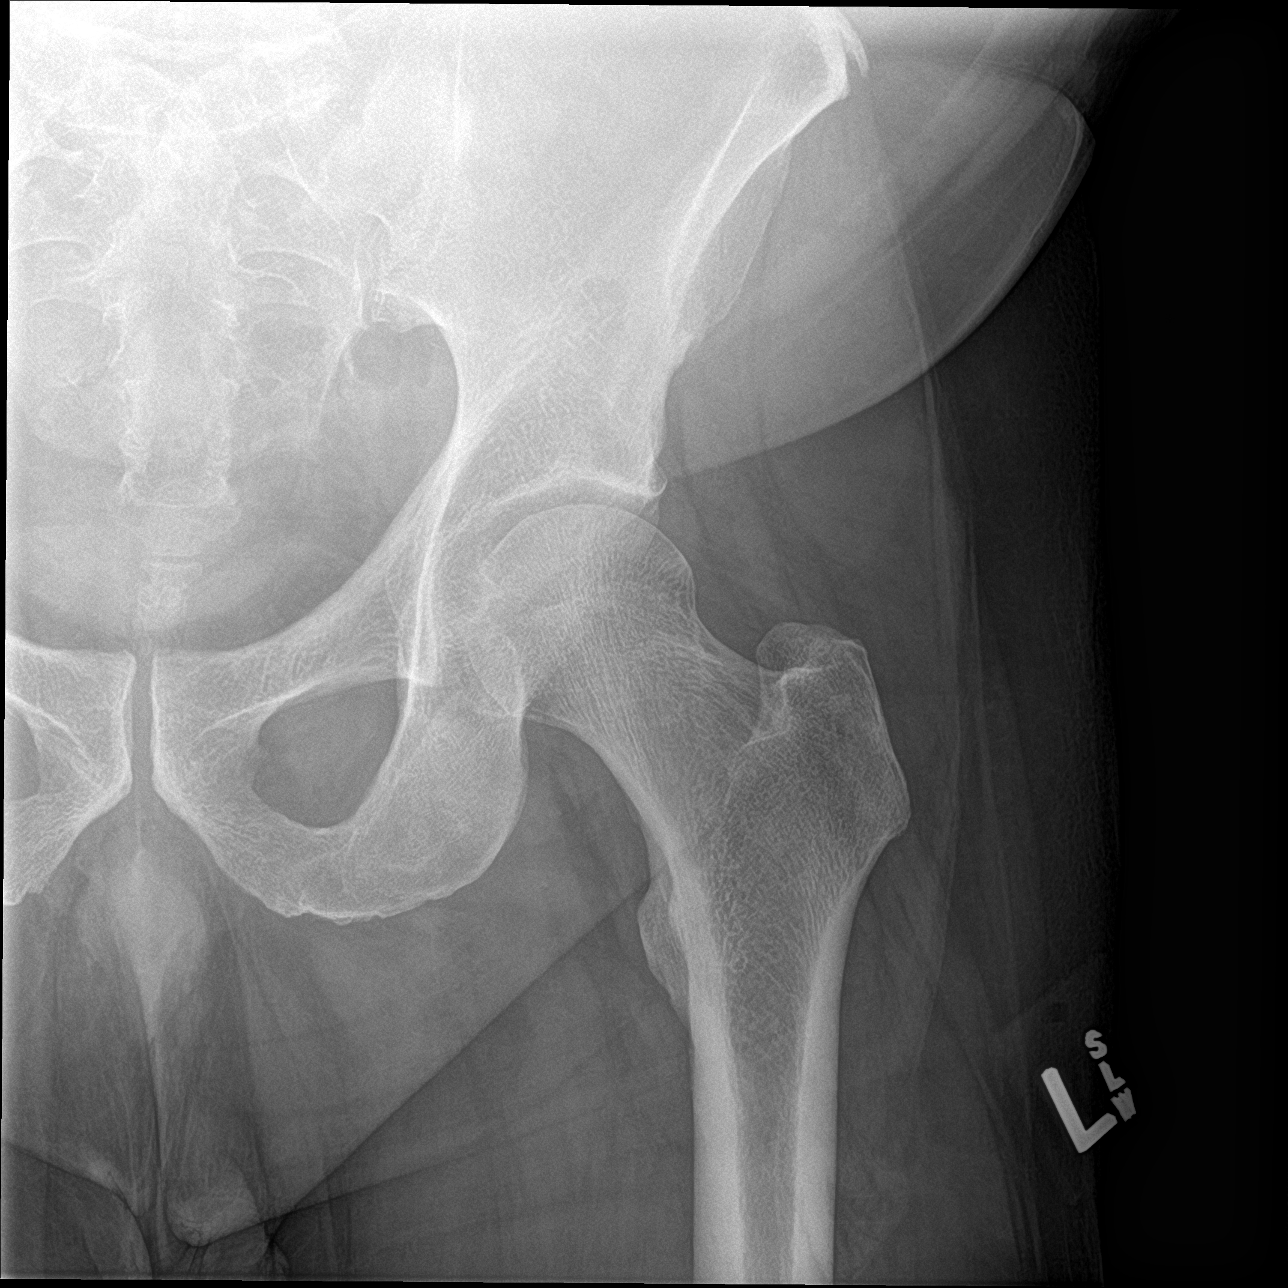

[hip lat]
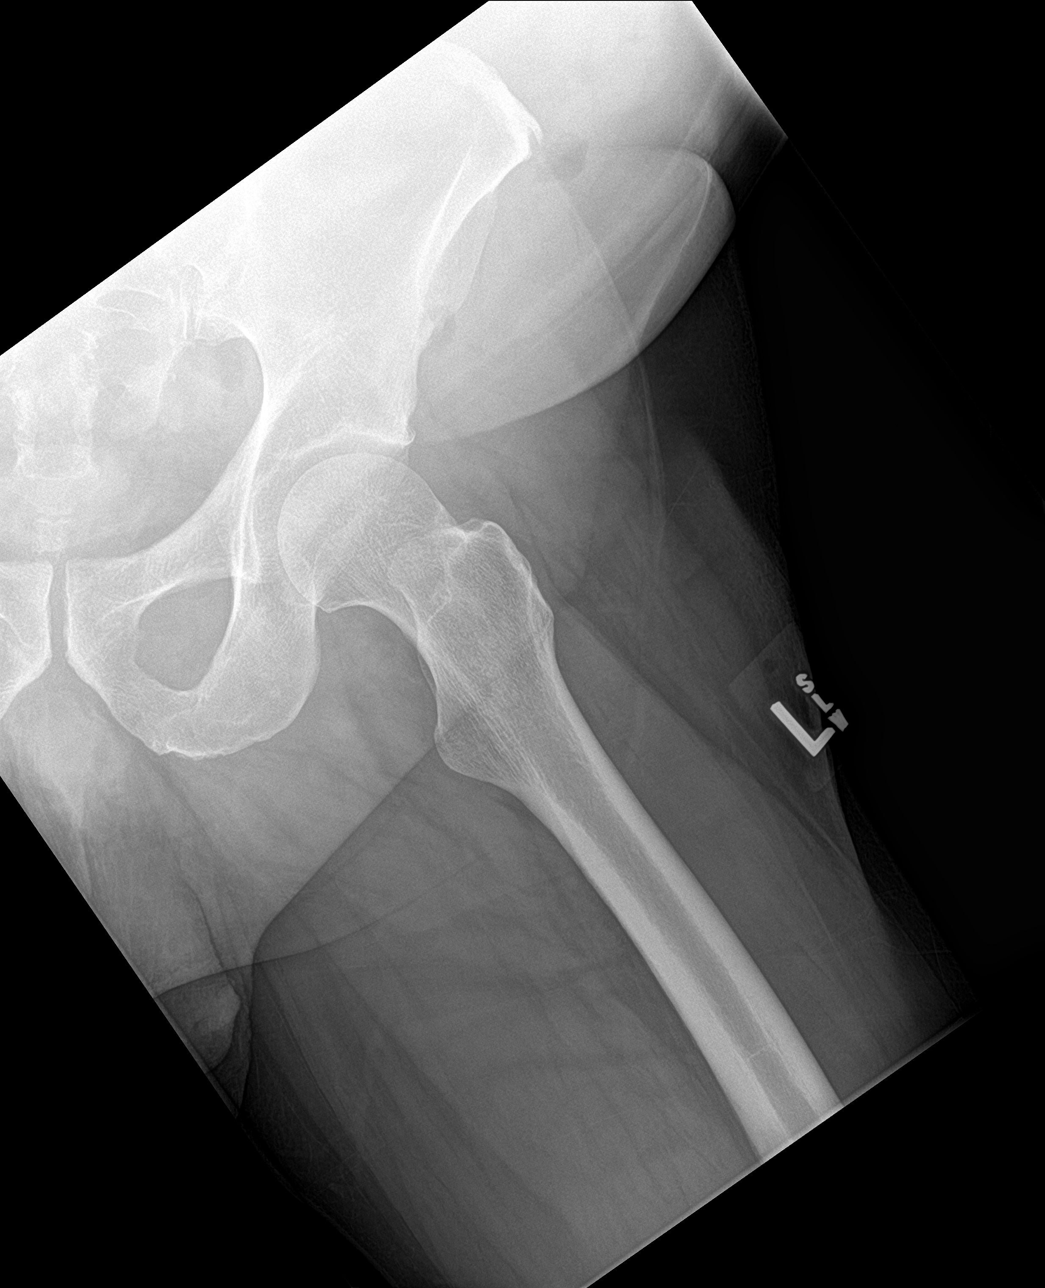

[3 of 3 positions shown; findings below may reference images not displayed]

FINDINGS: Osseous mineralization normal.

Symmetric hip and SI joints.

No fracture, dislocation, or bone destruction.
IMPRESSION: Normal exam.

## 2017-12-21 ENCOUNTER — Ambulatory Visit: Payer: BLUE CROSS/BLUE SHIELD | Admitting: Family Medicine

## 2017-12-21 ENCOUNTER — Encounter: Payer: Self-pay | Admitting: Family Medicine

## 2017-12-21 VITALS — BP 116/63 | HR 72 | Wt 212.0 lb

## 2017-12-21 DIAGNOSIS — M5442 Lumbago with sciatica, left side: Secondary | ICD-10-CM | POA: Diagnosis not present

## 2017-12-21 DIAGNOSIS — J0101 Acute recurrent maxillary sinusitis: Secondary | ICD-10-CM

## 2017-12-21 DIAGNOSIS — I10 Essential (primary) hypertension: Secondary | ICD-10-CM

## 2017-12-21 DIAGNOSIS — I2510 Atherosclerotic heart disease of native coronary artery without angina pectoris: Secondary | ICD-10-CM

## 2017-12-21 DIAGNOSIS — M25859 Other specified joint disorders, unspecified hip: Secondary | ICD-10-CM

## 2017-12-21 DIAGNOSIS — I700 Atherosclerosis of aorta: Secondary | ICD-10-CM

## 2017-12-21 DIAGNOSIS — I252 Old myocardial infarction: Secondary | ICD-10-CM

## 2017-12-21 DIAGNOSIS — Z6832 Body mass index (BMI) 32.0-32.9, adult: Secondary | ICD-10-CM

## 2017-12-21 DIAGNOSIS — M25552 Pain in left hip: Secondary | ICD-10-CM

## 2017-12-21 DIAGNOSIS — R7303 Prediabetes: Secondary | ICD-10-CM

## 2017-12-21 DIAGNOSIS — G4733 Obstructive sleep apnea (adult) (pediatric): Secondary | ICD-10-CM

## 2017-12-21 DIAGNOSIS — E782 Mixed hyperlipidemia: Secondary | ICD-10-CM

## 2017-12-21 DIAGNOSIS — F5101 Primary insomnia: Secondary | ICD-10-CM

## 2017-12-21 LAB — POCT GLYCOSYLATED HEMOGLOBIN (HGB A1C): Hemoglobin A1C: 5.9 % — AB (ref 4.0–5.6)

## 2017-12-21 MED ORDER — METOPROLOL SUCCINATE ER 50 MG PO TB24: 50.0000 mg | ORAL_TABLET | Freq: Every day | ORAL | 3 refills | Status: DC

## 2017-12-21 MED ORDER — NITROGLYCERIN 0.2 MG/HR TD PT24: MEDICATED_PATCH | TRANSDERMAL | 1 refills | Status: DC

## 2017-12-21 MED ORDER — PITAVASTATIN CALCIUM 4 MG PO TABS: 1.0000 | ORAL_TABLET | Freq: Every day | ORAL | 3 refills | Status: DC

## 2017-12-21 MED ORDER — OMEPRAZOLE 20 MG PO CPDR: 20.0000 mg | DELAYED_RELEASE_CAPSULE | Freq: Every day | ORAL | 3 refills | Status: DC

## 2017-12-21 MED ORDER — TEMAZEPAM 15 MG PO CAPS: 15.0000 mg | ORAL_CAPSULE | Freq: Every evening | ORAL | 5 refills | Status: DC | PRN

## 2017-12-21 MED ORDER — PREDNISONE 10 MG PO TABS: 30.0000 mg | ORAL_TABLET | Freq: Every day | ORAL | 0 refills | Status: DC

## 2017-12-21 MED ORDER — HYDROCODONE-ACETAMINOPHEN 10-325 MG PO TABS: 1.0000 | ORAL_TABLET | Freq: Three times a day (TID) | ORAL | 0 refills | Status: DC | PRN

## 2017-12-21 MED ORDER — LISINOPRIL-HYDROCHLOROTHIAZIDE 20-12.5 MG PO TABS: 1.0000 | ORAL_TABLET | Freq: Every day | ORAL | 3 refills | Status: DC

## 2017-12-21 MED ORDER — PRASUGREL HCL 10 MG PO TABS: 10.0000 mg | ORAL_TABLET | Freq: Every day | ORAL | 3 refills | Status: DC

## 2017-12-21 MED ORDER — AZITHROMYCIN 250 MG PO TABS: 250.0000 mg | ORAL_TABLET | Freq: Every day | ORAL | 0 refills | Status: DC

## 2017-12-21 MED ORDER — ALBUTEROL SULFATE HFA 108 (90 BASE) MCG/ACT IN AERS: 2.0000 | INHALATION_SPRAY | Freq: Four times a day (QID) | RESPIRATORY_TRACT | 3 refills | Status: DC | PRN

## 2017-12-21 NOTE — Progress Notes (Signed)
Vincent Wall is a 57 y.o. male who presents to [facility]: [facility] today for   Follow-up hypertension, hyperlipidemia, prediabetes, CAD risk, and chronic pain.  Hypertension, hyperlipidemia, CAD risk.  Taking medications listed below.  Doing well no chest pain or palpitations.  Patient does continue to smoke and is not interested in quitting at this time.  He notes that he needed occasions refilled sent to [facility].  Chronic pain: Vincent Wall has intermittent chronic low back pain.  He uses hydrocodone very infrequently.  His last prescription for hydrocodone was in August.  He occasionally use gabapentin as well.  He notes symptoms are typically well controlled.  He does continue home exercise program and over-the-counter medications as well.  Prediabetes/obesity: Vincent Wall tries to eat a lower carb diet.  Sinus pressure and pain x 1 month. Notes cough and congestion. Tried nasal spray, claritin D.  His symptoms are consistent with prior episodes of sinus infections.  He is done well with azithromycin in the past.  No fevers chills vomiting or diarrhea.  Symptoms are worse at bedtime.   ROS as above:  Exam:  BP 116/63   Pulse 72   Wt 212 lb (96.2 kg)   BMI 32.23 kg/m  Wt Readings from Last 5 Encounters:  12/21/17 212 lb (96.2 kg)  08/30/17 209 lb (94.8 kg)  06/20/17 212 lb (96.2 kg)  05/17/17 212 lb (96.2 kg)  02/28/17 214 lb (97.1 kg)    Gen: Well NAD HEENT: EOMI,  MMM tender to palpation maxillary sinuses.  Inflamed nasal turbinates with clear discharge.  Normal tympanic membranes.  Minimal cervical lymphadenopathy present Lungs: Normal work of breathing. CTABL Heart: RRR no MRG Abd: NABS, Soft. Nondistended, Nontender Exts: Brisk capillary refill, warm and well perfused.  L-spine normal-appearing normal motion.  Normal gait.  Some pain with back extension from  a seated to standing position.  Lab and Radiology Results Results for orders placed or performed in visit on 12/21/17 (from the past 72 hour(s))  POCT HgB A1C     Status: Abnormal   Collection Time: 12/21/17  8:42 AM  Result Value Ref Range   Hemoglobin A1C 5.9 (A) 4.0 - 5.6 %   HbA1c POC (<> result, manual entry)     HbA1c, POC (prediabetic range)     HbA1c, POC (controlled diabetic range)     Lab Results  Component Value Date   CHOL 161 06/20/2017   HDL 32 (L) 06/20/2017   LDLCALC 100 (H) 06/20/2017   TRIG 191 (H) 06/20/2017   CHOLHDL 5.0 (H) 06/20/2017     Chemistry      Component Value Date/Time   NA 137 06/20/2017 0858   NA 142 04/30/2011   K 4.5 06/20/2017 0858   CL 101 06/20/2017 0858   CO2 30 06/20/2017 0858   BUN 22 06/20/2017 0858   BUN 19 04/30/2011   CREATININE 1.22 06/20/2017 0858   GLU 111 04/30/2011      Component Value Date/Time   CALCIUM 9.2 06/20/2017 0858   ALKPHOS 53 01/16/2015 0848   AST 16 06/20/2017 0858   ALT 27 06/20/2017 0858   BILITOT 1.0 06/20/2017 0858         Assessment and Plan: 57 y.o. male with  Sinusitis: Failing typical conservative management.  Plan to proceed with azithromycin and backup prednisone if needed.  Hypertension CVD risk for CAD.  Doing well continue current regimen.  Check fasting lipids  at next visit in about 6 months.  Chronic pain: Doing reasonably well.  Continue current regimen.  Very infrequent use of hydrocodone.  Prediabetes and obesity.  Doing reasonably well continue to work on lower carbohydrate diet for weight loss and improve blood sugars.  A1c was improved today compared to previous labs.  Patient researched [facility] Controlled Substance Reporting System.  Orders Placed This Encounter  Procedures  . POCT HgB A1C   Meds ordered this encounter  Medications  . albuterol (PROVENTIL HFA;VENTOLIN HFA) 108 (90 Base) MCG/ACT inhaler    Sig: Inhale 2 puffs into the lungs every 6 (six) hours as  needed for wheezing or shortness of breath.    Dispense:  1 Inhaler    Refill:  3  . HYDROcodone-acetaminophen (NORCO) 10-325 MG tablet    Sig: Take 1-2 tablets by mouth every 8 (eight) hours as needed.    Dispense:  30 tablet    Refill:  0  . lisinopril-hydrochlorothiazide (PRINZIDE,ZESTORETIC) 20-12.5 MG tablet    Sig: Take 1 tablet by mouth daily.    Dispense:  90 tablet    Refill:  3  . metoprolol succinate (TOPROL-XL) 50 MG 24 hr tablet    Sig: Take 1 tablet (50 mg total) by mouth daily. Take with or immediately following a meal.    Dispense:  90 tablet    Refill:  3  . omeprazole (PRILOSEC) 20 MG capsule    Sig: Take 1 capsule (20 mg total) by mouth daily.    Dispense:  90 capsule    Refill:  3  . Pitavastatin Calcium 4 MG TABS    Sig: Take 1 tablet (4 mg total) by mouth daily.    Dispense:  90 tablet    Refill:  3    Failed prior statin with intolerable side effects.  . temazepam (RESTORIL) 15 MG capsule    Sig: Take 1-2 capsules (15-30 mg total) by mouth at bedtime as needed for sleep.    Dispense:  60 capsule    Refill:  5  . prasugrel (EFFIENT) 10 MG TABS tablet    Sig: Take 1 tablet (10 mg total) by mouth daily.    Dispense:  90 tablet    Refill:  3  . predniSONE (DELTASONE) 10 MG tablet    Sig: Take 3 tablets (30 mg total) by mouth daily with breakfast.    Dispense:  15 tablet    Refill:  0  . azithromycin (ZITHROMAX) 250 MG tablet    Sig: Take 1 tablet (250 mg total) by mouth daily. Take first 2 tablets together, then 1 every day until finished.    Dispense:  6 tablet    Refill:  0  . nitroGLYCERIN (NITRODUR - DOSED IN MG/24 HR) 0.2 mg/hr patch    Sig: 1/4 patch to foot daily for tendonitis    Dispense:  30 patch    Refill:  1     Historical information moved to improve visibility of documentation.  Past Medical History:  Diagnosis Date  . Heart attack (HCC)   . Heart disease   . Hyperlipidemia   . Hypertension    Past Surgical History:  Procedure  Laterality Date  . CORONARY STENT PLACEMENT  05/2010  . ELBOW SURGERY  2010   Social History   Tobacco Use  . Smoking status: Current Every Day Smoker    Packs/day: 1.00    Years: 20.00    Pack years: 20.00    Types: Cigarettes  .  Smokeless tobacco: Never Used  Substance Use Topics  . Alcohol use: Not Currently    Alcohol/week: 1.0 standard drinks    Types: 1 Standard drinks or equivalent per week   family history includes Breast cancer in his unknown relative; Diabetes in his unknown relative; Heart attack in his unknown relative; Hyperlipidemia in his unknown relative; Hypertension in his unknown relative.  Medications: Current Outpatient Medications  Medication Sig Dispense Refill  . albuterol (PROVENTIL HFA;VENTOLIN HFA) 108 (90 Base) MCG/ACT inhaler Inhale 2 puffs into the lungs every 6 (six) hours as needed for wheezing or shortness of breath. 1 Inhaler 3  . aspirin 81 MG tablet Take 81 mg by mouth daily.    Marland Kitchen. gabapentin (NEURONTIN) 300 MG capsule SEE NOTES 180 capsule 0  . HYDROcodone-acetaminophen (NORCO) 10-325 MG tablet Take 1-2 tablets by mouth every 8 (eight) hours as needed. 30 tablet 0  . lisinopril-hydrochlorothiazide (PRINZIDE,ZESTORETIC) 20-12.5 MG tablet Take 1 tablet by mouth daily. 90 tablet 3  . metoprolol succinate (TOPROL-XL) 50 MG 24 hr tablet Take 1 tablet (50 mg total) by mouth daily. Take with or immediately following a meal. 90 tablet 3  . nitroGLYCERIN (NITRODUR - DOSED IN MG/24 HR) 0.2 mg/hr patch 1/4 patch to foot daily for tendonitis 30 patch 1  . nitroGLYCERIN (NITROSTAT) 0.4 MG SL tablet Place 1 tablet (0.4 mg total) under the tongue every 5 (five) minutes as needed for chest pain. 50 tablet 3  . omeprazole (PRILOSEC) 20 MG capsule Take 1 capsule (20 mg total) by mouth daily. 90 capsule 3  . Pitavastatin Calcium 4 MG TABS Take 1 tablet (4 mg total) by mouth daily. 90 tablet 3  . prasugrel (EFFIENT) 10 MG TABS tablet Take 1 tablet (10 mg total) by  mouth daily. 90 tablet 3  . temazepam (RESTORIL) 15 MG capsule Take 1-2 capsules (15-30 mg total) by mouth at bedtime as needed for sleep. 60 capsule 5  . azithromycin (ZITHROMAX) 250 MG tablet Take 1 tablet (250 mg total) by mouth daily. Take first 2 tablets together, then 1 every day until finished. 6 tablet 0  . predniSONE (DELTASONE) 10 MG tablet Take 3 tablets (30 mg total) by mouth daily with breakfast. 15 tablet 0   No current facility-administered medications for this visit.    Allergies  Allergen Reactions  . Crestor [Rosuvastatin]     Myalgia    . Lipitor [Atorvastatin]     Myalgias  . Statins     Joint pain; pt states his body "locked up"     Discussed warning signs or symptoms. Please see discharge instructions. Patient expresses understanding.

## 2017-12-21 NOTE — Patient Instructions (Addendum)
Thank you for coming in today. Call or go to the emergency room if you get worse, have trouble breathing, have chest pains, or palpitations.  Continue over the counter medicine. Use prednisone and azithromycin for sinusitis.  Return in 6 months or sooner if needed.    Sinusitis, Adult Sinusitis is inflammation of your sinuses. Sinuses are hollow spaces in the bones around your face. Your sinuses are located:  Around your eyes.  In the middle of your forehead.  Behind your nose.  In your cheekbones. Mucus normally drains out of your sinuses. When your nasal tissues become inflamed or swollen, mucus can become trapped or blocked. This allows bacteria, viruses, and fungi to grow, which leads to infection. Most infections of the sinuses are caused by a virus. Sinusitis can develop quickly. It can last for up to 4 weeks (acute) or for more than 12 weeks (chronic). Sinusitis often develops after a cold. What are the causes? This condition is caused by anything that creates swelling in the sinuses or stops mucus from draining. This includes:  Allergies.  Asthma.  Infection from bacteria or viruses.  Deformities or blockages in your nose or sinuses.  Abnormal growths in the nose (nasal polyps).  Pollutants, such as chemicals or irritants in the air.  Infection from fungi (rare). What increases the risk? You are more likely to develop this condition if you:  Have a weak body defense system (immune system).  Do a lot of swimming or diving.  Overuse nasal sprays.  Smoke. What are the signs or symptoms? The main symptoms of this condition are pain and a feeling of pressure around the affected sinuses. Other symptoms include:  Stuffy nose or congestion.  Thick drainage from your nose.  Swelling and warmth over the affected sinuses.  Headache.  Upper toothache.  A cough that may get worse at night.  Extra mucus that collects in the throat or the back of the nose  (postnasal drip).  Decreased sense of smell and taste.  Fatigue.  A fever.  Sore throat.  Bad breath. How is this diagnosed? This condition is diagnosed based on:  Your symptoms.  Your medical history.  A physical exam.  Tests to find out if your condition is acute or chronic. This may include: ? Checking your nose for nasal polyps. ? Viewing your sinuses using a device that has a light (endoscope). ? Testing for allergies or bacteria. ? Imaging tests, such as an MRI or CT scan. In rare cases, a bone biopsy may be done to rule out more serious types of fungal sinus disease. How is this treated? Treatment for sinusitis depends on the cause and whether your condition is chronic or acute.  If caused by a virus, your symptoms should go away on their own within 10 days. You may be given medicines to relieve symptoms. They include: ? Medicines that shrink swollen nasal passages (topical intranasal decongestants). ? Medicines that treat allergies (antihistamines). ? A spray that eases inflammation of the nostrils (topical intranasal corticosteroids). ? Rinses that help get rid of thick mucus in your nose (nasal saline washes).  If caused by bacteria, your health care provider may recommend waiting to see if your symptoms improve. Most bacterial infections will get better without antibiotic medicine. You may be given antibiotics if you have: ? A severe infection. ? A weak immune system.  If caused by narrow nasal passages or nasal polyps, you may need to have surgery. Follow these instructions at home: Medicines  Take, use, or apply over-the-counter and prescription medicines only as told by your health care provider. These may include nasal sprays.  If you were prescribed an antibiotic medicine, take it as told by your health care provider. Do not stop taking the antibiotic even if you start to feel better. Hydrate and humidify   Drink enough fluid to keep your urine pale  yellow. Staying hydrated will help to thin your mucus.  Use a cool mist humidifier to keep the humidity level in your home above 50%.  Inhale steam for 10-15 minutes, 3-4 times a day, or as told by your health care provider. You can do this in the bathroom while a hot shower is running.  Limit your exposure to cool or dry air. Rest  Rest as much as possible.  Sleep with your head raised (elevated).  Make sure you get enough sleep each night. General instructions   Apply a warm, moist washcloth to your face 3-4 times a day or as told by your health care provider. This will help with discomfort.  Wash your hands often with soap and water to reduce your exposure to germs. If soap and water are not available, use hand sanitizer.  Do not smoke. Avoid being around people who are smoking (secondhand smoke).  Keep all follow-up visits as told by your health care provider. This is important. Contact a health care provider if:  You have a fever.  Your symptoms get worse.  Your symptoms do not improve within 10 days. Get help right away if:  You have a severe headache.  You have persistent vomiting.  You have severe pain or swelling around your face or eyes.  You have vision problems.  You develop confusion.  Your neck is stiff.  You have trouble breathing. Summary  Sinusitis is soreness and inflammation of your sinuses. Sinuses are hollow spaces in the bones around your face.  This condition is caused by nasal tissues that become inflamed or swollen. The swelling traps or blocks the flow of mucus. This allows bacteria, viruses, and fungi to grow, which leads to infection.  If you were prescribed an antibiotic medicine, take it as told by your health care provider. Do not stop taking the antibiotic even if you start to feel better.  Keep all follow-up visits as told by your health care provider. This is important. This information is not intended to replace advice given to  you by your health care provider. Make sure you discuss any questions you have with your health care provider. Document Released: 12/21/2004 Document Revised: 05/23/2017 Document Reviewed: 05/23/2017 Elsevier Interactive Patient Education  2019 [employer]

## 2018-02-07 ENCOUNTER — Telehealth: Payer: Self-pay

## 2018-02-07 DIAGNOSIS — H938X3 Other specified disorders of ear, bilateral: Secondary | ICD-10-CM

## 2018-02-07 NOTE — Telephone Encounter (Signed)
Done

## 2018-02-07 NOTE — Telephone Encounter (Signed)
Pt advised. Would like to go to [place] location. Will advise referral coordinator.

## 2018-02-07 NOTE — Telephone Encounter (Signed)
Patient has been advised that was referral was sent to [facility]. Office phone number was given to the patient.Rhonda Cunningham,CMA

## 2018-02-07 NOTE — Telephone Encounter (Signed)
Referral to ENT placed

## 2018-02-07 NOTE — Telephone Encounter (Signed)
Patient called requested a referral for an ENT to have tubes put in his ear because the fluid is not going away and is driving him crazy. Please advise. Rhonda Cunningham,CMA

## 2018-02-08 DIAGNOSIS — H6593 Unspecified nonsuppurative otitis media, bilateral: Secondary | ICD-10-CM | POA: Diagnosis not present

## 2018-02-08 DIAGNOSIS — H906 Mixed conductive and sensorineural hearing loss, bilateral: Secondary | ICD-10-CM | POA: Diagnosis not present

## 2018-02-22 DIAGNOSIS — J309 Allergic rhinitis, unspecified: Secondary | ICD-10-CM | POA: Diagnosis not present

## 2018-02-22 DIAGNOSIS — H6593 Unspecified nonsuppurative otitis media, bilateral: Secondary | ICD-10-CM | POA: Diagnosis not present

## 2018-02-22 DIAGNOSIS — H906 Mixed conductive and sensorineural hearing loss, bilateral: Secondary | ICD-10-CM | POA: Diagnosis not present

## 2018-02-22 DIAGNOSIS — Z9622 Myringotomy tube(s) status: Secondary | ICD-10-CM | POA: Diagnosis not present

## 2018-04-10 ENCOUNTER — Encounter: Payer: Self-pay | Admitting: Family Medicine

## 2018-04-10 ENCOUNTER — Ambulatory Visit: Payer: BLUE CROSS/BLUE SHIELD | Admitting: Family Medicine

## 2018-04-10 ENCOUNTER — Other Ambulatory Visit: Payer: Self-pay

## 2018-04-10 VITALS — BP 131/68 | HR 82 | Ht 68.0 in | Wt 216.0 lb

## 2018-04-10 DIAGNOSIS — G8929 Other chronic pain: Secondary | ICD-10-CM

## 2018-04-10 DIAGNOSIS — M545 Low back pain, unspecified: Secondary | ICD-10-CM

## 2018-04-10 DIAGNOSIS — L03116 Cellulitis of left lower limb: Secondary | ICD-10-CM

## 2018-04-10 MED ORDER — HYDROCODONE-ACETAMINOPHEN 10-325 MG PO TABS: 1.0000 | ORAL_TABLET | Freq: Three times a day (TID) | ORAL | 0 refills | Status: DC | PRN

## 2018-04-10 MED ORDER — CEFDINIR 300 MG PO CAPS: 300.0000 mg | ORAL_CAPSULE | Freq: Two times a day (BID) | ORAL | 0 refills | Status: DC

## 2018-04-10 MED ORDER — DOXYCYCLINE HYCLATE 100 MG PO TABS: 100.0000 mg | ORAL_TABLET | Freq: Two times a day (BID) | ORAL | 0 refills | Status: DC

## 2018-04-10 NOTE — Progress Notes (Signed)
Vincent Wall is a 58 y.o. male who presents to [facility]: [facility] today for cellulitis.  Vincent noted a small pimple on his left medial calf last week.  He was able to pop it but not express much pus.  He was feeling pretty good until 2 3 days ago when his calf became red and swollen and very painful.  He denies fevers or chills and feels well otherwise.  He thinks he may have developed a skin infection.  He notes his grandson recently was diagnosed with impetigo.  He is not sure if the staph bacteria was MRSA or just regular staph.  He is not tried much treatment yet.  Additionally he notes that he is about due for a refill of his hydrocodone.  He takes it very occasionally for back pain and notes that he is about to run out.  He notes his back pain is moderate and sometimes worse sometimes better.  He is able to function with the medication.   ROS as above:  Exam:  BP 131/68    Pulse 82    Ht 5\' 8"  (1.727 m)    Wt 216 lb (98 kg)    BMI 32.84 kg/m  Wt Readings from Last 5 Encounters:  04/10/18 216 lb (98 kg)  12/21/17 212 lb (96.2 kg)  08/30/17 209 lb (94.8 kg)  06/20/17 212 lb (96.2 kg)  05/17/17 212 lb (96.2 kg)    Gen: Well NAD HEENT: EOMI,  MMM Lungs: Normal work of breathing. CTABL Heart: RRR no MRG Abd: NABS, Soft. Nondistended, Nontender Exts: Brisk capillary refill, warm and well perfused.  Left leg erythematous papule without fluctuance.  Tender to palpation.  Surrounding erythema extending from the central papule by about 4 cm in diameter.  Tender to palpation.  Normal calf and ankle motion.  No subcutaneous emphysema or crepitations present.     Lab and Radiology Results No results found for this or any previous visit (from the past 72 hour(s)). No results found.    Assessment and Plan: 58 y.o. male with left calf cellulitis.  Patient likely had early  folliculitis and now has developed what appears to be cellulitis.  Necrotizing fasciitis very unlikely.  Plan to treat with doxycycline and Omnicef to double cover staph and strep species.  Recheck sooner if not improving or if worsening.  Will refill hydrocodone for chronic back pain.  Cautions reviewed.  Recheck sooner if needed.  PDMP reviewed during this encounter. No orders of the defined types were placed in this encounter.  Meds ordered this encounter  Medications   doxycycline (VIBRA-TABS) 100 MG tablet    Sig: Take 1 tablet (100 mg total) by mouth 2 (two) times daily.    Dispense:  14 tablet    Refill:  0   cefdinir (OMNICEF) 300 MG capsule    Sig: Take 1 capsule (300 mg total) by mouth 2 (two) times daily.    Dispense:  14 capsule    Refill:  0   HYDROcodone-acetaminophen (NORCO) 10-325 MG tablet    Sig: Take 1-2 tablets by mouth every 8 (eight) hours as needed.    Dispense:  30 tablet    Refill:  0     Historical information moved to improve visibility of documentation.  Past Medical History:  Diagnosis Date   Heart attack (HCC)    Heart disease    Hyperlipidemia    Hypertension    Past  Surgical History:  Procedure Laterality Date   CORONARY STENT PLACEMENT  05/2010   ELBOW SURGERY  2010   Social History   Tobacco Use   Smoking status: Current Every Day Smoker    Packs/day: 1.00    Years: 20.00    Pack years: 20.00    Types: Cigarettes   Smokeless tobacco: Never Used  Substance Use Topics   Alcohol use: Not Currently    Alcohol/week: 1.0 standard drinks    Types: 1 Standard drinks or equivalent per week   family history includes Breast cancer in his unknown relative; Diabetes in his unknown relative; Heart attack in his unknown relative; Hyperlipidemia in his unknown relative; Hypertension in his unknown relative.  Medications: Current Outpatient Medications  Medication Sig Dispense Refill   albuterol (PROVENTIL HFA;VENTOLIN HFA) 108  (90 Base) MCG/ACT inhaler Inhale 2 puffs into the lungs every 6 (six) hours as needed for wheezing or shortness of breath. 1 Inhaler 3   aspirin 81 MG tablet Take 81 mg by mouth daily.     gabapentin (NEURONTIN) 300 MG capsule SEE NOTES 180 capsule 0   HYDROcodone-acetaminophen (NORCO) 10-325 MG tablet Take 1-2 tablets by mouth every 8 (eight) hours as needed. 30 tablet 0   lisinopril-hydrochlorothiazide (PRINZIDE,ZESTORETIC) 20-12.5 MG tablet Take 1 tablet by mouth daily. 90 tablet 3   metoprolol succinate (TOPROL-XL) 50 MG 24 hr tablet Take 1 tablet (50 mg total) by mouth daily. Take with or immediately following a meal. 90 tablet 3   nitroGLYCERIN (NITRODUR - DOSED IN MG/24 HR) 0.2 mg/hr patch 1/4 patch to foot daily for tendonitis 30 patch 1   nitroGLYCERIN (NITROSTAT) 0.4 MG SL tablet Place 1 tablet (0.4 mg total) under the tongue every 5 (five) minutes as needed for chest pain. 50 tablet 3   omeprazole (PRILOSEC) 20 MG capsule Take 1 capsule (20 mg total) by mouth daily. 90 capsule 3   Pitavastatin Calcium 4 MG TABS Take 1 tablet (4 mg total) by mouth daily. 90 tablet 3   prasugrel (EFFIENT) 10 MG TABS tablet Take 1 tablet (10 mg total) by mouth daily. 90 tablet 3   temazepam (RESTORIL) 15 MG capsule Take 1-2 capsules (15-30 mg total) by mouth at bedtime as needed for sleep. 60 capsule 5   cefdinir (OMNICEF) 300 MG capsule Take 1 capsule (300 mg total) by mouth 2 (two) times daily. 14 capsule 0   doxycycline (VIBRA-TABS) 100 MG tablet Take 1 tablet (100 mg total) by mouth 2 (two) times daily. 14 tablet 0   No current facility-administered medications for this visit.    Allergies  Allergen Reactions   Crestor [Rosuvastatin]     Myalgia     Lipitor [Atorvastatin]     Myalgias   Statins     Joint pain; pt states his body "locked up"     Discussed warning signs or symptoms. Please see discharge instructions. Patient expresses understanding.

## 2018-04-10 NOTE — Patient Instructions (Addendum)
Thank you for coming in today. Take doxycycline and omnicef antibiotic twice daily for 1 week.  It should settle down quickly.  Start the antibiotic ASAP.     Cellulitis, Adult  Cellulitis is a skin infection. The infected area is usually warm, red, swollen, and tender. This condition occurs most often in the arms and lower legs. The infection can travel to the muscles, blood, and underlying tissue and become serious. It is very important to get treated for this condition. What are the causes? Cellulitis is caused by bacteria. The bacteria enter through a break in the skin, such as a cut, burn, insect bite, open sore, or crack. What increases the risk? This condition is more likely to occur in people who:  Have a weak body defense system (immune system).  Have open wounds on the skin, such as cuts, burns, bites, and scrapes. Bacteria can enter the body through these open wounds.  Are older than 60 years of age.  Have diabetes.  Have a type of long-lasting (chronic) liver disease (cirrhosis) or kidney disease.  Are obese.  Have a skin condition such as: ? Itchy rash (eczema). ? Slow movement of blood in the veins (venous stasis). ? Fluid buildup below the skin (edema).  Have had radiation therapy.  Use IV drugs. What are the signs or symptoms? Symptoms of this condition include:  Redness, streaking, or spotting on the skin.  Swollen area of the skin.  Tenderness or pain when an area of the skin is touched.  Warm skin.  A fever.  Chills.  Blisters. How is this diagnosed? This condition is diagnosed based on a medical history and physical exam. You may also have tests, including:  Blood tests.  Imaging tests. How is this treated? Treatment for this condition may include:  Medicines, such as antibiotic medicines or medicines to treat allergies (antihistamines).  Supportive care, such as rest and application of cold or warm cloths (compresses) to the  skin.  Hospital care, if the condition is severe. The infection usually starts to get better within 1-2 days of treatment. Follow these instructions at home:  Medicines  Take over-the-counter and prescription medicines only as told by your health care provider.  If you were prescribed an antibiotic medicine, take it as told by your health care provider. Do not stop taking the antibiotic even if you start to feel better. General instructions  Drink enough fluid to keep your urine pale yellow.  Do not touch or rub the infected area.  Raise (elevate) the infected area above the level of your heart while you are sitting or lying down.  Apply warm or cold compresses to the affected area as told by your health care provider.  Keep all follow-up visits as told by your health care provider. This is important. These visits let your health care provider make sure a more serious infection is not developing. Contact a health care provider if:  You have a fever.  Your symptoms do not begin to improve within 1-2 days of starting treatment.  Your bone or joint underneath the infected area becomes painful after the skin has healed.  Your infection returns in the same area or another area.  You notice a swollen bump in the infected area.  You develop new symptoms.  You have a general ill feeling (malaise) with muscle aches and pains. Get help right away if:  Your symptoms get worse.  You feel very sleepy.  You develop vomiting or diarrhea that persists.  You notice red streaks coming from the infected area.  Your red area gets larger or turns dark in color. These symptoms may represent a serious problem that is an emergency. Do not wait to see if the symptoms will go away. Get medical help right away. Call your local emergency services (911 in the [place] Do not drive yourself to the hospital. Summary  Cellulitis is a skin infection. This condition occurs most often in the arms and  lower legs.  Treatment for this condition may include medicines, such as antibiotic medicines or antihistamines.  Take over-the-counter and prescription medicines only as told by your health care provider. If you were prescribed an antibiotic medicine, do not stop taking the antibiotic even if you start to feel better.  Contact a health care provider if your symptoms do not begin to improve within 1-2 days of starting treatment or your symptoms get worse.  Keep all follow-up visits as told by your health care provider. This is important. These visits let your health care provider make sure that a more serious infection is not developing. This information is not intended to replace advice given to you by your health care provider. Make sure you discuss any questions you have with your health care provider. Document Released: 09/30/2004 Document Revised: 05/12/2017 Document Reviewed: 05/12/2017 Elsevier Interactive Patient Education  2019 [employer].

## 2018-04-25 DIAGNOSIS — Z955 Presence of coronary angioplasty implant and graft: Secondary | ICD-10-CM | POA: Diagnosis not present

## 2018-04-25 DIAGNOSIS — E785 Hyperlipidemia, unspecified: Secondary | ICD-10-CM | POA: Diagnosis not present

## 2018-04-25 DIAGNOSIS — I10 Essential (primary) hypertension: Secondary | ICD-10-CM | POA: Diagnosis not present

## 2018-06-15 ENCOUNTER — Ambulatory Visit: Payer: BC Managed Care – PPO | Admitting: Family Medicine

## 2018-06-15 ENCOUNTER — Encounter: Payer: Self-pay | Admitting: Family Medicine

## 2018-06-15 VITALS — BP 116/76 | HR 71 | Temp 97.9°F | Wt 213.0 lb

## 2018-06-15 DIAGNOSIS — Z5181 Encounter for therapeutic drug level monitoring: Secondary | ICD-10-CM

## 2018-06-15 DIAGNOSIS — I10 Essential (primary) hypertension: Secondary | ICD-10-CM

## 2018-06-15 DIAGNOSIS — Z720 Tobacco use: Secondary | ICD-10-CM

## 2018-06-15 DIAGNOSIS — Z125 Encounter for screening for malignant neoplasm of prostate: Secondary | ICD-10-CM

## 2018-06-15 DIAGNOSIS — M722 Plantar fascial fibromatosis: Secondary | ICD-10-CM

## 2018-06-15 DIAGNOSIS — M545 Low back pain, unspecified: Secondary | ICD-10-CM

## 2018-06-15 DIAGNOSIS — N200 Calculus of kidney: Secondary | ICD-10-CM | POA: Insufficient documentation

## 2018-06-15 DIAGNOSIS — I700 Atherosclerosis of aorta: Secondary | ICD-10-CM | POA: Diagnosis not present

## 2018-06-15 DIAGNOSIS — I252 Old myocardial infarction: Secondary | ICD-10-CM

## 2018-06-15 DIAGNOSIS — E782 Mixed hyperlipidemia: Secondary | ICD-10-CM

## 2018-06-15 DIAGNOSIS — F5101 Primary insomnia: Secondary | ICD-10-CM

## 2018-06-15 DIAGNOSIS — Z955 Presence of coronary angioplasty implant and graft: Secondary | ICD-10-CM | POA: Insufficient documentation

## 2018-06-15 DIAGNOSIS — R7303 Prediabetes: Secondary | ICD-10-CM

## 2018-06-15 DIAGNOSIS — M5416 Radiculopathy, lumbar region: Secondary | ICD-10-CM

## 2018-06-15 DIAGNOSIS — E1169 Type 2 diabetes mellitus with other specified complication: Secondary | ICD-10-CM

## 2018-06-15 DIAGNOSIS — G8929 Other chronic pain: Secondary | ICD-10-CM

## 2018-06-15 DIAGNOSIS — I2510 Atherosclerotic heart disease of native coronary artery without angina pectoris: Secondary | ICD-10-CM | POA: Diagnosis not present

## 2018-06-15 DIAGNOSIS — E790 Hyperuricemia without signs of inflammatory arthritis and tophaceous disease: Secondary | ICD-10-CM

## 2018-06-15 DIAGNOSIS — G894 Chronic pain syndrome: Secondary | ICD-10-CM

## 2018-06-15 MED ORDER — LISINOPRIL-HYDROCHLOROTHIAZIDE 20-12.5 MG PO TABS: 1.0000 | ORAL_TABLET | Freq: Every day | ORAL | 3 refills | Status: DC

## 2018-06-15 MED ORDER — LEVOCETIRIZINE DIHYDROCHLORIDE 5 MG PO TABS: 5.0000 mg | ORAL_TABLET | Freq: Every day | ORAL | 1 refills | Status: DC

## 2018-06-15 MED ORDER — HYDROCODONE-ACETAMINOPHEN 10-325 MG PO TABS: 1.0000 | ORAL_TABLET | Freq: Three times a day (TID) | ORAL | 0 refills | Status: DC | PRN

## 2018-06-15 MED ORDER — PITAVASTATIN CALCIUM 4 MG PO TABS: 1.0000 | ORAL_TABLET | Freq: Every day | ORAL | 3 refills | Status: DC

## 2018-06-15 MED ORDER — METOPROLOL SUCCINATE ER 50 MG PO TB24: 50.0000 mg | ORAL_TABLET | Freq: Every day | ORAL | 3 refills | Status: DC

## 2018-06-15 MED ORDER — PRASUGREL HCL 10 MG PO TABS: 10.0000 mg | ORAL_TABLET | Freq: Every day | ORAL | 3 refills | Status: DC

## 2018-06-15 MED ORDER — OMEPRAZOLE 20 MG PO CPDR: 20.0000 mg | DELAYED_RELEASE_CAPSULE | Freq: Every day | ORAL | 3 refills | Status: DC

## 2018-06-15 MED ORDER — TEMAZEPAM 15 MG PO CAPS: 15.0000 mg | ORAL_CAPSULE | Freq: Every evening | ORAL | 5 refills | Status: DC | PRN

## 2018-06-15 NOTE — Progress Notes (Signed)
Vincent Wall is a 58 y.o. male who [facility] today for hypertension, hyperlipidemia, back and hip pain.   Vincent Wall is doing reasonably well.  He takes medications listed below for hypertension and hyperlipidemia/CAD.  He tolerates them well with no chest pain palpitations shortness of breath lightheadedness or dizziness.  He notes that he needs refills of most of his medications.  Additionally he has chronic back pain and hip pain.  He takes hydrocodone typically 1 5 mg pill daily sometimes 2.  He notes he does not take this every day.  He does note that it is very helpful and helps manage his pain.  He ran out about a week ago.  Additionally he uses temazepam for insomnia.  This also is very helpful.  He was recently started on Xyzal by his ENT doctor this is helped his sinuses a great deal.   ROS as above:  Exam:  BP 116/76   Pulse 71   Temp 97.9 F (36.6 C) (Oral)   Wt 213 lb (96.6 kg)   BMI 32.39 kg/m  Wt Readings from Last 5 Encounters:  06/15/18 213 lb (96.6 kg)  04/10/18 216 lb (98 kg)  12/21/17 212 lb (96.2 kg)  08/30/17 209 lb (94.8 kg)  06/20/17 212 lb (96.2 kg)    Gen: Well NAD HEENT: EOMI,  MMM Lungs: Normal work of breathing. CTABL Heart: RRR no MRG Abd: NABS, Soft. Nondistended, Nontender Exts: Brisk capillary refill, warm and well perfused.  L-spine: Nontender to midline decreased lumbar motion.    Assessment and Plan: 58 y.o. male with  Hypertension: Doing well.  Plan to continue blood pressure management.  Check metabolic panel today.  Recheck in 6 months or sooner if needed.  Hyperlipidemia and coronary artery disease.  Doing well continue Pitavastatin check lipid panel.  We will check urine drug screen for chronic pain management.  Also check PSA for prostate cancer screening and hemoglobin A1c for prediabetes  evaluation and follow-up.    Chronic medications refilled.  PDMP reviewed during this encounter. Orders Placed This Encounter  Procedures  . CBC  . COMPLETE METABOLIC PANEL WITH GFR  . Lipid Panel w/reflex Direct LDL  . Hemoglobin A1c  . Uric acid  . PSA  . Pain Mgmt, Profile 4 Conf w/o mM, U   Meds ordered this encounter  Medications  . levocetirizine (XYZAL) 5 MG tablet    Sig: Take 1 tablet (5 mg total) by mouth daily.    Dispense:  90 tablet    Refill:  1  . lisinopril-hydrochlorothiazide (ZESTORETIC) 20-12.5 MG tablet    Sig: Take 1 tablet by mouth daily.    Dispense:  90 tablet    Refill:  3  . metoprolol succinate (TOPROL-XL) 50 MG 24 hr tablet    Sig: Take 1 tablet (50 mg total) by mouth daily. Take with or immediately following a meal.    Dispense:  90 tablet    Refill:  3  . omeprazole (PRILOSEC) 20 MG capsule    Sig: Take 1 capsule (20 mg total) by mouth daily.    Dispense:  90 capsule    Refill:  3  . Pitavastatin Calcium 4 MG TABS    Sig: Take 1 tablet (4 mg total) by mouth daily.    Dispense:  90 tablet    Refill:  3    Failed prior statin with intolerable side effects.  . prasugrel (  EFFIENT) 10 MG TABS tablet    Sig: Take 1 tablet (10 mg total) by mouth daily.    Dispense:  90 tablet    Refill:  3  . HYDROcodone-acetaminophen (NORCO) 10-325 MG tablet    Sig: Take 1-2 tablets by mouth every 8 (eight) hours as needed.    Dispense:  60 tablet    Refill:  0  . temazepam (RESTORIL) 15 MG capsule    Sig: Take 1-2 capsules (15-30 mg total) by mouth at bedtime as needed for sleep.    Dispense:  60 capsule    Refill:  5     Historical information moved to improve visibility of documentation.  Past Medical History:  Diagnosis Date  . Heart attack ([place])   . Heart disease   . Hyperlipidemia   . Hypertension    Past Surgical History:  Procedure Laterality Date  . CORONARY STENT PLACEMENT  05/2010  . ELBOW SURGERY  2010   Social History   Tobacco  Use  . Smoking status: Current Every Day Smoker    Packs/day: 1.00    Years: 20.00    Pack years: 20.00    Types: Cigarettes  . Smokeless tobacco: Never Used  Substance Use Topics  . Alcohol use: Not Currently    Alcohol/week: 1.0 standard drinks    Types: 1 Standard drinks or equivalent per week   family history includes Breast cancer in his unknown relative; Diabetes in his unknown relative; Heart attack in his unknown relative; Hyperlipidemia in his unknown relative; Hypertension in his unknown relative.  Medications: Current Outpatient Medications  Medication Sig Dispense Refill  . albuterol (PROVENTIL HFA;VENTOLIN HFA) 108 (90 Base) MCG/ACT inhaler Inhale 2 puffs into the lungs every 6 (six) hours as needed for wheezing or shortness of breath. 1 Inhaler 3  . aspirin 81 MG tablet Take 81 mg by mouth daily.    Marland Kitchen gabapentin (NEURONTIN) 300 MG capsule SEE NOTES 180 capsule 0  . HYDROcodone-acetaminophen (NORCO) 10-325 MG tablet Take 1-2 tablets by mouth every 8 (eight) hours as needed. 60 tablet 0  . levocetirizine (XYZAL) 5 MG tablet Take 1 tablet (5 mg total) by mouth daily. 90 tablet 1  . lisinopril-hydrochlorothiazide (ZESTORETIC) 20-12.5 MG tablet Take 1 tablet by mouth daily. 90 tablet 3  . metoprolol succinate (TOPROL-XL) 50 MG 24 hr tablet Take 1 tablet (50 mg total) by mouth daily. Take with or immediately following a meal. 90 tablet 3  . nitroGLYCERIN (NITRODUR - DOSED IN MG/24 HR) 0.2 mg/hr patch 1/4 patch to foot daily for tendonitis 30 patch 1  . nitroGLYCERIN (NITROSTAT) 0.4 MG SL tablet Place 1 tablet (0.4 mg total) under the tongue every 5 (five) minutes as needed for chest pain. 50 tablet 3  . omeprazole (PRILOSEC) 20 MG capsule Take 1 capsule (20 mg total) by mouth daily. 90 capsule 3  . Pitavastatin Calcium 4 MG TABS Take 1 tablet (4 mg total) by mouth daily. 90 tablet 3  . prasugrel (EFFIENT) 10 MG TABS tablet Take 1 tablet (10 mg total) by mouth daily. 90 tablet 3   . temazepam (RESTORIL) 15 MG capsule Take 1-2 capsules (15-30 mg total) by mouth at bedtime as needed for sleep. 60 capsule 5   No current facility-administered medications for this visit.    Allergies  Allergen Reactions  . Citalopram Hydrobromide     GI  . Crestor [Rosuvastatin]     Myalgia    . Lipitor [Atorvastatin]     Myalgias  .  Nabumetone     GI  . Viagra  [Sildenafil Citrate]     GI upset     Discussed warning signs or symptoms. Please see discharge instructions. Patient expresses understanding.

## 2018-06-15 NOTE — Patient Instructions (Signed)
Thank you for coming in today. Recheck in 6 months if all is well.

## 2018-06-16 ENCOUNTER — Encounter: Payer: Self-pay | Admitting: Family Medicine

## 2018-06-16 DIAGNOSIS — E790 Hyperuricemia without signs of inflammatory arthritis and tophaceous disease: Secondary | ICD-10-CM

## 2018-06-16 HISTORY — DX: Hyperuricemia without signs of inflammatory arthritis and tophaceous disease: E79.0

## 2018-06-16 LAB — COMPLETE METABOLIC PANEL WITH GFR
AG Ratio: 1.7 (calc) (ref 1.0–2.5)
ALT: 45 U/L (ref 9–46)
AST: 28 U/L (ref 10–35)
Albumin: 4 g/dL (ref 3.6–5.1)
Alkaline phosphatase (APISO): 71 U/L (ref 35–144)
BUN: 14 mg/dL (ref 7–25)
CO2: 25 mmol/L (ref 20–32)
Calcium: 9.2 mg/dL (ref 8.6–10.3)
Chloride: 105 mmol/L (ref 98–110)
Creat: 1.1 mg/dL (ref 0.70–1.25)
GFR, Est African American: 84 mL/min/{1.73_m2} (ref >=60)
GFR, Est Non African American: 73 mL/min/{1.73_m2} (ref >=60)
Globulin: 2.4 g/dL (calc) (ref 1.9–3.7)
Glucose, Bld: 145 mg/dL — ABNORMAL HIGH (ref 65–99)
Potassium: 4.2 mmol/L (ref 3.5–5.3)
Sodium: 139 mmol/L (ref 135–146)
Total Bilirubin: 1.1 mg/dL (ref 0.2–1.2)
Total Protein: 6.4 g/dL (ref 6.1–8.1)

## 2018-06-16 LAB — URIC ACID: Uric Acid, Serum: 9.6 mg/dL — ABNORMAL HIGH (ref 4.0–8.0)

## 2018-06-16 LAB — LIPID PANEL W/REFLEX DIRECT LDL
Cholesterol: 185 mg/dL (ref <200)
HDL: 34 mg/dL — ABNORMAL LOW (ref >=40)
LDL Cholesterol (Calc): 117 mg/dL (calc) — ABNORMAL HIGH
Non-HDL Cholesterol (Calc): 151 mg/dL (calc) — ABNORMAL HIGH (ref <130)
Total CHOL/HDL Ratio: 5.4 (calc) — ABNORMAL HIGH (ref <5.0)
Triglycerides: 218 mg/dL — ABNORMAL HIGH (ref <150)

## 2018-06-16 LAB — CBC
HCT: 44.1 % (ref 38.5–50.0)
Hemoglobin: 14.6 g/dL (ref 13.2–17.1)
MCH: 27.9 pg (ref 27.0–33.0)
MCHC: 33.1 g/dL (ref 32.0–36.0)
MCV: 84.2 fL (ref 80.0–100.0)
MPV: 9.9 fL (ref 7.5–12.5)
Platelets: 171 10*3/uL (ref 140–400)
RBC: 5.24 10*6/uL (ref 4.20–5.80)
RDW: 14.3 % (ref 11.0–15.0)
WBC: 6 10*3/uL (ref 3.8–10.8)

## 2018-06-16 LAB — PSA: PSA: 0.8 ng/mL (ref <=4.0)

## 2018-06-16 LAB — HEMOGLOBIN A1C
Hgb A1c MFr Bld: 6.8 % of total Hgb — ABNORMAL HIGH (ref <5.7)
Mean Plasma Glucose: 148 (calc)
eAG (mmol/L): 8.2 (calc)

## 2018-06-16 MED ORDER — ALLOPURINOL 100 MG PO TABS: 100.0000 mg | ORAL_TABLET | Freq: Every day | ORAL | 1 refills | Status: DC

## 2018-06-16 NOTE — Addendum Note (Signed)
Addended by: Gregor Hams on: 06/16/2018 06:40 AM   Modules accepted: Orders

## 2018-06-17 LAB — PAIN MGMT, PROFILE 4 CONF W/O MM, U
Alphahydroxyalprazolam: NEGATIVE ng/mL
Alphahydroxymidazolam: NEGATIVE ng/mL
Alphahydroxytriazolam: NEGATIVE ng/mL
Aminoclonazepam: NEGATIVE ng/mL
Amphetamines: NEGATIVE ng/mL
Barbiturates: NEGATIVE ng/mL
Benzodiazepines: POSITIVE ng/mL
Cocaine Metabolite: NEGATIVE ng/mL
Creatinine: 208 mg/dL
Hydroxyethylflurazepam: NEGATIVE ng/mL
Lorazepam: NEGATIVE ng/mL
Methadone Metabolite: NEGATIVE ng/mL
Nordiazepam: NEGATIVE ng/mL
Opiates: NEGATIVE ng/mL
Oxazepam: 1186 ng/mL
Oxidant: NEGATIVE ug/mL
Oxycodone: NEGATIVE ng/mL
Phencyclidine: NEGATIVE ng/mL
Temazepam: >8000 ng/mL
pH: 5.4 (ref 4.5–9.0)

## 2018-07-03 ENCOUNTER — Other Ambulatory Visit: Payer: Self-pay | Admitting: Family Medicine

## 2018-07-03 DIAGNOSIS — M5442 Lumbago with sciatica, left side: Secondary | ICD-10-CM

## 2018-07-03 DIAGNOSIS — M25552 Pain in left hip: Secondary | ICD-10-CM

## 2018-07-03 DIAGNOSIS — M25859 Other specified joint disorders, unspecified hip: Secondary | ICD-10-CM

## 2018-07-03 NOTE — Telephone Encounter (Signed)
Mickel Baas from [facility] called and requested updated refill for Gabapentin.Pt is asking to transfer rx but they did not have any refills left.

## 2018-07-04 MED ORDER — GABAPENTIN 300 MG PO CAPS: ORAL_CAPSULE | ORAL | 3 refills | Status: DC

## 2018-08-15 ENCOUNTER — Encounter: Payer: Self-pay | Admitting: Family Medicine

## 2018-09-25 ENCOUNTER — Telehealth: Payer: Self-pay | Admitting: Family Medicine

## 2018-09-25 NOTE — Telephone Encounter (Signed)
Received fax from [employer] that Livalo requires a PA. Information has been sent to the insurance company. Awaiting determination.   

## 2018-09-26 NOTE — Telephone Encounter (Signed)
Received a fax from [employer] that Livalo has been approved from 09/25/2018 through 09/25/2019. Pharmacy aware and form sent to scan.  The cost is around $200. I have left a message for the patient to call back.

## 2018-09-28 NOTE — Telephone Encounter (Signed)
Did not receive call back. Closing encounter.

## 2018-10-09 ENCOUNTER — Other Ambulatory Visit: Payer: Self-pay

## 2018-10-10 MED ORDER — TEMAZEPAM 15 MG PO CAPS: 15.0000 mg | ORAL_CAPSULE | Freq: Every evening | ORAL | 5 refills | Status: DC | PRN

## 2018-10-10 MED ORDER — HYDROCODONE-ACETAMINOPHEN 10-325 MG PO TABS: 1.0000 | ORAL_TABLET | Freq: Three times a day (TID) | ORAL | 0 refills | Status: DC | PRN

## 2018-12-06 ENCOUNTER — Other Ambulatory Visit: Payer: Self-pay | Admitting: Family Medicine

## 2018-12-09 ENCOUNTER — Other Ambulatory Visit: Payer: Self-pay | Admitting: Family Medicine

## 2018-12-09 DIAGNOSIS — M25552 Pain in left hip: Secondary | ICD-10-CM

## 2018-12-09 DIAGNOSIS — M25859 Other specified joint disorders, unspecified hip: Secondary | ICD-10-CM

## 2018-12-09 DIAGNOSIS — M5442 Lumbago with sciatica, left side: Secondary | ICD-10-CM

## 2018-12-09 MED ORDER — GABAPENTIN 300 MG PO CAPS: ORAL_CAPSULE | ORAL | 3 refills | Status: DC

## 2018-12-15 ENCOUNTER — Ambulatory Visit: Payer: BC Managed Care – PPO | Admitting: Family Medicine

## 2019-01-02 ENCOUNTER — Other Ambulatory Visit: Payer: Self-pay | Admitting: Physician Assistant

## 2019-01-13 ENCOUNTER — Other Ambulatory Visit: Payer: Self-pay | Admitting: Physician Assistant

## 2019-03-09 ENCOUNTER — Other Ambulatory Visit: Payer: Self-pay | Admitting: Sports Medicine

## 2019-03-09 NOTE — Telephone Encounter (Signed)
Must make appointment 

## 2019-04-12 ENCOUNTER — Other Ambulatory Visit: Payer: Self-pay | Admitting: Family Medicine

## 2019-04-19 ENCOUNTER — Other Ambulatory Visit: Payer: Self-pay | Admitting: Family Medicine

## 2019-04-24 ENCOUNTER — Other Ambulatory Visit: Payer: Self-pay

## 2019-04-24 ENCOUNTER — Encounter: Payer: Self-pay | Admitting: Family Medicine

## 2019-04-24 ENCOUNTER — Ambulatory Visit (INDEPENDENT_AMBULATORY_CARE_PROVIDER_SITE_OTHER): Payer: 59 | Admitting: Family Medicine

## 2019-04-24 VITALS — BP 132/81 | HR 83 | Temp 98.2°F | Ht 68.0 in | Wt 203.0 lb

## 2019-04-24 DIAGNOSIS — E1169 Type 2 diabetes mellitus with other specified complication: Secondary | ICD-10-CM

## 2019-04-24 DIAGNOSIS — M5416 Radiculopathy, lumbar region: Secondary | ICD-10-CM

## 2019-04-24 DIAGNOSIS — F1190 Opioid use, unspecified, uncomplicated: Secondary | ICD-10-CM | POA: Diagnosis not present

## 2019-04-24 DIAGNOSIS — I2510 Atherosclerotic heart disease of native coronary artery without angina pectoris: Secondary | ICD-10-CM

## 2019-04-24 DIAGNOSIS — J449 Chronic obstructive pulmonary disease, unspecified: Secondary | ICD-10-CM | POA: Insufficient documentation

## 2019-04-24 DIAGNOSIS — R7303 Prediabetes: Secondary | ICD-10-CM

## 2019-04-24 DIAGNOSIS — E782 Mixed hyperlipidemia: Secondary | ICD-10-CM

## 2019-04-24 DIAGNOSIS — I10 Essential (primary) hypertension: Secondary | ICD-10-CM

## 2019-04-24 LAB — POCT GLYCOSYLATED HEMOGLOBIN (HGB A1C): Hemoglobin A1C: 7.5 % — AB (ref 4.0–5.6)

## 2019-04-24 MED ORDER — JARDIANCE 10 MG PO TABS: 10.0000 mg | ORAL_TABLET | Freq: Every day | ORAL | 2 refills | Status: DC

## 2019-04-24 MED ORDER — ALBUTEROL SULFATE HFA 108 (90 BASE) MCG/ACT IN AERS: 2.0000 | INHALATION_SPRAY | Freq: Four times a day (QID) | RESPIRATORY_TRACT | 6 refills | Status: DC | PRN

## 2019-04-24 MED ORDER — PITAVASTATIN CALCIUM 4 MG PO TABS: 1.0000 | ORAL_TABLET | Freq: Every day | ORAL | 3 refills | Status: DC

## 2019-04-24 MED ORDER — HYDROCODONE-ACETAMINOPHEN 10-325 MG PO TABS: 1.0000 | ORAL_TABLET | Freq: Three times a day (TID) | ORAL | 0 refills | Status: DC | PRN

## 2019-04-24 MED ORDER — LISINOPRIL-HYDROCHLOROTHIAZIDE 20-12.5 MG PO TABS: 1.0000 | ORAL_TABLET | Freq: Every day | ORAL | 3 refills | Status: DC

## 2019-04-24 MED ORDER — ALLOPURINOL 100 MG PO TABS: 100.0000 mg | ORAL_TABLET | Freq: Every day | ORAL | 3 refills | Status: DC

## 2019-04-24 MED ORDER — METOPROLOL SUCCINATE ER 50 MG PO TB24: 50.0000 mg | ORAL_TABLET | Freq: Every day | ORAL | 3 refills | Status: DC

## 2019-04-24 MED ORDER — TEMAZEPAM 15 MG PO CAPS: 15.0000 mg | ORAL_CAPSULE | Freq: Every evening | ORAL | 5 refills | Status: DC | PRN

## 2019-04-24 MED ORDER — OMEPRAZOLE 20 MG PO CPDR: 20.0000 mg | DELAYED_RELEASE_CAPSULE | Freq: Every day | ORAL | 3 refills | Status: DC

## 2019-04-24 NOTE — Assessment & Plan Note (Signed)
Stable at this time, he will continue albuterol PRN Counseled on smoking cessation.

## 2019-04-24 NOTE — Assessment & Plan Note (Signed)
Stable, denies anginal symptoms.  Reminded to make f/u with cardiology.  He will continue on effient and asa for now.

## 2019-04-24 NOTE — Assessment & Plan Note (Signed)
Blood pressure is at goal at for age and co-morbidities.  I recommend he continue lisinopri/hctz and toprol.  In addition they were instructed to follow a low sodium diet with regular exercise to help to maintain adequate control of blood pressure.

## 2019-04-24 NOTE — Assessment & Plan Note (Signed)
Worsening control of diabetes.   Will add jardiance given history of CAD Discussed following low carb diet with regular activity/exercise.

## 2019-04-24 NOTE — Progress Notes (Signed)
Vincent Wall - 59 y.o. male MRN [id number]  Date of birth: [birth year]  Subjective No chief complaint on file.   HPI  Vincent Wall is a 59 y.o. male with history of HTN, T2DM, CAD s/p stent, lumbar radiculopathy, and insomnia here today for follow up visit.   -HTN/CAD:  History of CAD with stenting to RCA in 2012 due to STEMI.  He has been maintained on effient and asa per cardiology.  BP has been well controlled with lisinopril/hctz and metoprolol.  Denies anginal symptoms or equivalents.    -T2DM:  He is not currently taking any medications for management of his diabetes.  Due for updated a1c.  He denies any symptoms related to diabetes.  Weight is down about 10lbs since last year.  Denies changes to diet or exercise.   -Insomnia:  Current management with temazepam.  This is working well for him. He denies excess sedation or daytime fatigue.    -Lumbar radiculopathy:  Unable to utilize NSAIDS due to effient use and history of CAD.  He has been prescribed gabapentin and norco.  He uses norco 1-2 x/per day as needed.  Typically only needs a few days per month, sometimes more often.    -COPD:  Current daily smoker, no interest in quitting.  He uses albuterol as needed which works well for him.  He denies increased need for albuterol, shortness of breath, or wheezing.   ROS:  A comprehensive ROS was completed and negative except as noted per HPI  Allergies  Allergen Reactions  . Citalopram Hydrobromide     GI  . Crestor [Rosuvastatin]     Myalgia    . Lipitor [Atorvastatin]     Myalgias  . Nabumetone     GI  . Viagra  [Sildenafil Citrate]     GI upset    Past Medical History:  Diagnosis Date  . Elevated uric acid in blood 06/16/2018  . Heart attack ([facility])   . Heart disease   . Hyperlipidemia   . Hypertension     Past Surgical History:  Procedure Laterality Date  . CORONARY STENT PLACEMENT  05/2010  . ELBOW SURGERY  2010    Social History   Socioeconomic History  .  Marital status: Married    Spouse name: Not on file  . Number of children: Not on file  . Years of education: Not on file  . Highest education level: Not on file  Occupational History  . Not on file  Tobacco Use  . Smoking status: Current Every Day Smoker    Packs/day: 1.00    Years: 20.00    Pack years: 20.00    Types: Cigarettes  . Smokeless tobacco: Never Used  Substance and Sexual Activity  . Alcohol use: Not Currently    Alcohol/week: 1.0 standard drinks    Types: 1 Standard drinks or equivalent per week  . Drug use: No  . Sexual activity: Yes    Partners: Female  Other Topics Concern  . Not on file  Social History Narrative  . Not on file   Social Determinants of Health   Financial Resource Strain:   . Difficulty of Paying Living Expenses:   Food Insecurity:   . Worried About Programme researcher, broadcasting/film/video in the Last Year:   . Barista in the Last Year:   Transportation Needs:   . Freight forwarder (Medical):   Marland Kitchen Lack of Transportation (Non-Medical):   Physical Activity:   . Days of  Exercise per Week:   . Minutes of Exercise per Session:   Stress:   . Feeling of Stress :   Social Connections:   . Frequency of Communication with Friends and Family:   . Frequency of Social Gatherings with Friends and Family:   . Attends Religious Services:   . Active Member of Clubs or Organizations:   . Attends Archivist Meetings:   Marland Kitchen Marital Status:     Family History  Problem Relation Age of Onset  . Breast cancer Unknown   . Heart attack Unknown   . Diabetes Unknown   . Hyperlipidemia Unknown   . Hypertension Unknown     Health Maintenance  Topic Date Due  . FOOT EXAM  Never done  . OPHTHALMOLOGY EXAM  Never done  . HIV Screening  Never done  . COVID-19 Vaccine (1) Never done  . TETANUS/TDAP  12/06/2018  . HEMOGLOBIN A1C  12/15/2018  . COLONOSCOPY  01/05/2023 (Originally 01/06/2008)  . INFLUENZA VACCINE  08/05/2019  . PNEUMOCOCCAL POLYSACCHARIDE  VACCINE AGE 53-64 HIGH RISK  Completed  . Hepatitis C Screening  Completed     ----------------------------------------------------------------------------------------------------------------------------------------------------------------------------------------------------------------- Physical Exam BP 132/81   Pulse 83   Temp 98.2 F (36.8 C) (Oral)   Ht 5\' 8"  (1.727 m)   Wt 203 lb (92.1 kg)   BMI 30.87 kg/m   Physical Exam Constitutional:      Appearance: Normal appearance.  HENT:     Head: Normocephalic and atraumatic.  Eyes:     General: No scleral icterus. Cardiovascular:     Rate and Rhythm: Normal rate and regular rhythm.  Pulmonary:     Effort: Pulmonary effort is normal.     Breath sounds: Normal breath sounds.  Musculoskeletal:     Cervical back: Neck supple.  Skin:    General: Skin is warm and dry.  Neurological:     General: No focal deficit present.     Mental Status: He is alert.  Psychiatric:        Mood and Affect: Mood normal.        Behavior: Behavior normal.    Diabetic Foot Exam - Simple   Simple Foot Form Diabetic Foot exam was performed with the following findings: Yes 04/24/2019  9:40 PM  Visual Inspection No deformities, no ulcerations, no other skin breakdown bilaterally: Yes Sensation Testing Intact to touch and monofilament testing bilaterally: Yes Pulse Check Posterior Tibialis and Dorsalis pulse intact bilaterally: Yes Comments      ------------------------------------------------------------------------------------------------------------------------------------------------------------------------------------------------------------------- Assessment and Plan  Essential hypertension, benign Blood pressure is at goal at for age and co-morbidities.  I recommend he continue lisinopri/hctz and toprol.  In addition they were instructed to follow a low sodium diet with regular exercise to help to maintain adequate control of blood  pressure.    CAD (coronary artery disease) Stable, denies anginal symptoms.  Reminded to make f/u with cardiology.  He will continue on effient and asa for now.   Type 2 diabetes mellitus with other specified complication (HCC) Worsening control of diabetes.   Will add jardiance given history of CAD Discussed following low carb diet with regular activity/exercise.   Lumbar radiculopathy Managed with gabapentin.  Has norco as well as needed.  Updated UDS ordered and updated opioid contract completed today.   Hyperlipidemia Intolerant to several statins Pitavastatin renewed. Update lipids.  COPD (chronic obstructive pulmonary disease) (HCC) Stable at this time, he will continue albuterol PRN Counseled on smoking cessation.  Meds ordered this encounter  Medications  . HYDROcodone-acetaminophen (NORCO) 10-325 MG tablet    Sig: Take 1-2 tablets by mouth every 8 (eight) hours as needed.    Dispense:  60 tablet    Refill:  0    Call pt when ready  . temazepam (RESTORIL) 15 MG capsule    Sig: Take 1-2 capsules (15-30 mg total) by mouth at bedtime as needed for sleep.    Dispense:  60 capsule    Refill:  5    Call pt when ready  . albuterol (VENTOLIN HFA) 108 (90 Base) MCG/ACT inhaler    Sig: Inhale 2 puffs into the lungs every 6 (six) hours as needed for wheezing or shortness of breath.    Dispense:  16 g    Refill:  6  . allopurinol (ZYLOPRIM) 100 MG tablet    Sig: Take 1 tablet (100 mg total) by mouth daily.    Dispense:  90 tablet    Refill:  3  . lisinopril-hydrochlorothiazide (ZESTORETIC) 20-12.5 MG tablet    Sig: Take 1 tablet by mouth daily.    Dispense:  90 tablet    Refill:  3  . metoprolol succinate (TOPROL-XL) 50 MG 24 hr tablet    Sig: Take 1 tablet (50 mg total) by mouth daily. Take with or immediately following a meal.    Dispense:  90 tablet    Refill:  3  . omeprazole (PRILOSEC) 20 MG capsule    Sig: Take 1 capsule (20 mg total) by mouth daily.     Dispense:  90 capsule    Refill:  3  . Pitavastatin Calcium 4 MG TABS    Sig: Take 1 tablet (4 mg total) by mouth daily.    Dispense:  90 tablet    Refill:  3    Failed prior statin with intolerable side effects.  . empagliflozin (JARDIANCE) 10 MG TABS tablet    Sig: Take 10 mg by mouth daily before breakfast.    Dispense:  90 tablet    Refill:  2    Return in about 6 months (around 10/24/2019) for HTN/T2DM.    This visit occurred during the SARS-CoV-2 public health emergency.  Safety protocols were in place, including screening questions prior to the visit, additional usage of staff PPE, and extensive cleaning of exam room while observing appropriate contact time as indicated for disinfecting solutions.

## 2019-04-24 NOTE — Telephone Encounter (Signed)
Pt needs to establish with new PCP

## 2019-04-24 NOTE — Assessment & Plan Note (Signed)
Intolerant to several statins Pitavastatin renewed. Update lipids.

## 2019-04-24 NOTE — Telephone Encounter (Signed)
Appointment has been made already. No further questions. He has been seen today.

## 2019-04-24 NOTE — Assessment & Plan Note (Signed)
Managed with gabapentin.  Has norco as well as needed.  Updated UDS ordered and updated opioid contract completed today.

## 2019-04-24 NOTE — Patient Instructions (Addendum)
Nice to meet you! Continue current medications. Lab Results  Component Value Date   HGBA1C 7.5 (A) 04/24/2019   Control of blood sugars has worsened.  I would recommend adding Jardiance to better control this.  This is also helpful for you heart.   Please have labs completed.  We can't continue medications without you having routine labs.  Please schedule follow up with your cardiologist as well.

## 2019-04-26 ENCOUNTER — Telehealth: Payer: Self-pay | Admitting: Family Medicine

## 2019-04-26 NOTE — Telephone Encounter (Signed)
Received fax for PA Jardiance sent through cover my meds waiting on determination. - CF

## 2019-04-27 LAB — CMP14+EGFR
ALT: 42 IU/L (ref 0–44)
AST: 31 IU/L (ref 0–40)
Albumin/Globulin Ratio: 1.6 (ref 1.2–2.2)
Albumin: 4.1 g/dL (ref 3.8–4.8)
Alkaline Phosphatase: 97 IU/L (ref 39–117)
BUN/Creatinine Ratio: 13 (ref 10–24)
BUN: 14 mg/dL (ref 8–27)
Bilirubin Total: 0.8 mg/dL (ref 0.0–1.2)
CO2: 26 mmol/L (ref 20–29)
Calcium: 9.3 mg/dL (ref 8.6–10.2)
Chloride: 100 mmol/L (ref 96–106)
Creatinine, Ser: 1.09 mg/dL (ref 0.76–1.27)
GFR calc Af Amer: 84 mL/min/{1.73_m2} (ref >59)
GFR calc non Af Amer: 73 mL/min/{1.73_m2} (ref >59)
Globulin, Total: 2.5 g/dL (ref 1.5–4.5)
Glucose: 264 mg/dL — ABNORMAL HIGH (ref 65–99)
Potassium: 4.3 mmol/L (ref 3.5–5.2)
Sodium: 138 mmol/L (ref 134–144)
Total Protein: 6.6 g/dL (ref 6.0–8.5)

## 2019-04-27 LAB — CBC
Hematocrit: 44.2 % (ref 37.5–51.0)
Hemoglobin: 14.5 g/dL (ref 13.0–17.7)
MCH: 28.1 pg (ref 26.6–33.0)
MCHC: 32.8 g/dL (ref 31.5–35.7)
MCV: 86 fL (ref 79–97)
Platelets: 190 10*3/uL (ref 150–450)
RBC: 5.16 x10E6/uL (ref 4.14–5.80)
RDW: 13.2 % (ref 11.6–15.4)
WBC: 6.3 10*3/uL (ref 3.4–10.8)

## 2019-04-27 LAB — LIPID PANEL
Chol/HDL Ratio: 7.4 ratio — ABNORMAL HIGH (ref 0.0–5.0)
Cholesterol, Total: 222 mg/dL — ABNORMAL HIGH (ref 100–199)
HDL: 30 mg/dL — ABNORMAL LOW (ref >39)
LDL Chol Calc (NIH): 147 mg/dL — ABNORMAL HIGH (ref 0–99)
Triglycerides: 242 mg/dL — ABNORMAL HIGH (ref 0–149)
VLDL Cholesterol Cal: 45 mg/dL — ABNORMAL HIGH (ref 5–40)

## 2019-05-01 ENCOUNTER — Other Ambulatory Visit: Payer: Self-pay

## 2019-05-01 LAB — TOXASSURE SELECT 13 (MW), URINE

## 2019-05-01 NOTE — Telephone Encounter (Signed)
Received fax from [employer] and they denied coverage on Jardiance due to patient has to have a failure to Metformin. Placing in providers box for review. - CF

## 2019-05-08 ENCOUNTER — Other Ambulatory Visit: Payer: Self-pay

## 2019-05-08 MED ORDER — JARDIANCE 10 MG PO TABS: 10.0000 mg | ORAL_TABLET | Freq: Every day | ORAL | 2 refills | Status: DC

## 2019-05-08 MED ORDER — PITAVASTATIN CALCIUM 4 MG PO TABS: 1.0000 | ORAL_TABLET | Freq: Every day | ORAL | 3 refills | Status: DC

## 2019-05-09 LAB — HM DIABETES EYE EXAM

## 2019-05-10 ENCOUNTER — Telehealth: Payer: Self-pay | Admitting: Family Medicine

## 2019-05-10 NOTE — Telephone Encounter (Signed)
Received fax for PA on Livalo sent through cover my meds waiting on determination. - CF 

## 2019-05-14 ENCOUNTER — Encounter: Payer: Self-pay | Admitting: Family Medicine

## 2019-05-31 NOTE — Telephone Encounter (Signed)
Checked on PA through cover my meds and received authorization for Livalo  Case Number: [id number] Valid: 05/10/2019 - 05/09/2020. - CF

## 2019-06-01 ENCOUNTER — Other Ambulatory Visit: Payer: Self-pay | Admitting: *Deleted

## 2019-06-01 ENCOUNTER — Other Ambulatory Visit: Payer: Self-pay | Admitting: Family Medicine

## 2019-06-01 MED ORDER — HYDROCODONE-ACETAMINOPHEN 10-325 MG PO TABS: 1.0000 | ORAL_TABLET | Freq: Three times a day (TID) | ORAL | 0 refills | Status: DC | PRN

## 2019-06-01 NOTE — Telephone Encounter (Signed)
Pt reports that he called and lvm asking for refill of hydrocodone-acetaminophen 10-325.   He called the pharmacy and was told to contact his pcp. Pt reports that he is going to be leaving for the weekend and would like a refill before he leaves.

## 2019-06-09 ENCOUNTER — Other Ambulatory Visit: Payer: Self-pay | Admitting: Sports Medicine

## 2019-06-11 NOTE — Telephone Encounter (Signed)
Hulk Hogan refill request to PCP

## 2019-07-22 ENCOUNTER — Other Ambulatory Visit: Payer: Self-pay | Admitting: Family Medicine

## 2019-07-24 ENCOUNTER — Other Ambulatory Visit: Payer: Self-pay

## 2019-07-24 MED ORDER — PRASUGREL HCL 10 MG PO TABS: 10.0000 mg | ORAL_TABLET | Freq: Every day | ORAL | 0 refills | Status: DC

## 2019-08-13 ENCOUNTER — Other Ambulatory Visit: Payer: Self-pay | Admitting: Family Medicine

## 2019-08-13 NOTE — Telephone Encounter (Signed)
Refill request submitted via MyChart. Routing to assistant to address.  

## 2019-08-15 NOTE — Telephone Encounter (Signed)
Any update on this? Did we change medication?   Jardiance still on current med list, please Francee Piccolo

## 2019-08-16 NOTE — Telephone Encounter (Signed)
No indication in the chart regarding next steps or follow up conversations re: Jardiance. May need to wait for Dr. Ashley Royalty to return for more info.

## 2019-08-23 ENCOUNTER — Other Ambulatory Visit: Payer: Self-pay | Admitting: Family Medicine

## 2019-08-23 ENCOUNTER — Other Ambulatory Visit: Payer: Self-pay

## 2019-08-23 MED ORDER — HYDROCODONE-ACETAMINOPHEN 10-325 MG PO TABS: 1.0000 | ORAL_TABLET | Freq: Three times a day (TID) | ORAL | 0 refills | Status: DC | PRN

## 2019-08-23 MED ORDER — METFORMIN HCL ER 500 MG PO TB24: 1000.0000 mg | ORAL_TABLET | Freq: Every day | ORAL | 1 refills | Status: DC

## 2019-08-23 NOTE — Telephone Encounter (Signed)
He was concerned about GI side effects related to metformin.  Will have him try metformin ER.  Rx sent in .

## 2019-08-30 ENCOUNTER — Telehealth: Payer: Self-pay | Admitting: Family Medicine

## 2019-08-30 NOTE — Telephone Encounter (Signed)
Received fax for PA on Livalo sent through cover my meds waiting on determination. - CF 

## 2019-08-30 NOTE — Telephone Encounter (Signed)
Received fax for PA on Humalog sent through cover my meds and received authorization.  PA Case: [id number] Valid 08/29/19 - 08/28/20  I faxed to pharmacy so they can fill medication. For patient - CF

## 2019-09-04 NOTE — Telephone Encounter (Signed)
Received a fax from [facility] stating the medication was previously approved on [id number] from 05/10/19 - 05/09/20 and patient can fill the prescription at their pharmacy. If the pharmacy has questions they can call [facility] help desk at [phone]. - CF

## 2019-10-24 ENCOUNTER — Encounter: Payer: Self-pay | Admitting: Family Medicine

## 2019-10-24 ENCOUNTER — Ambulatory Visit: Payer: 59 | Admitting: Family Medicine

## 2019-10-24 ENCOUNTER — Other Ambulatory Visit: Payer: Self-pay

## 2019-10-24 VITALS — BP 121/65 | HR 72 | Wt 203.8 lb

## 2019-10-24 DIAGNOSIS — I2510 Atherosclerotic heart disease of native coronary artery without angina pectoris: Secondary | ICD-10-CM

## 2019-10-24 DIAGNOSIS — M5442 Lumbago with sciatica, left side: Secondary | ICD-10-CM | POA: Diagnosis not present

## 2019-10-24 DIAGNOSIS — M5416 Radiculopathy, lumbar region: Secondary | ICD-10-CM

## 2019-10-24 DIAGNOSIS — I10 Essential (primary) hypertension: Secondary | ICD-10-CM

## 2019-10-24 DIAGNOSIS — M25552 Pain in left hip: Secondary | ICD-10-CM

## 2019-10-24 DIAGNOSIS — M25859 Other specified joint disorders, unspecified hip: Secondary | ICD-10-CM | POA: Diagnosis not present

## 2019-10-24 DIAGNOSIS — F5101 Primary insomnia: Secondary | ICD-10-CM

## 2019-10-24 DIAGNOSIS — E1169 Type 2 diabetes mellitus with other specified complication: Secondary | ICD-10-CM | POA: Diagnosis not present

## 2019-10-24 LAB — POCT GLYCOSYLATED HEMOGLOBIN (HGB A1C): Hemoglobin A1C: 7.2 % — AB (ref 4.0–5.6)

## 2019-10-24 MED ORDER — TEMAZEPAM 15 MG PO CAPS: 15.0000 mg | ORAL_CAPSULE | Freq: Every evening | ORAL | 5 refills | Status: DC | PRN

## 2019-10-24 MED ORDER — EMPAGLIFLOZIN 10 MG PO TABS: 10.0000 mg | ORAL_TABLET | Freq: Every day | ORAL | 2 refills | Status: DC

## 2019-10-24 NOTE — Patient Instructions (Signed)
Let's try changing the metformin to jardiance Schedule follow up with cardiology.  See me again in about 6 months or sooner if needed.

## 2019-10-24 NOTE — Progress Notes (Signed)
Vincent Wall - 59 y.o. male MRN [id number]  Date of birth: February 07, 1960  Subjective Chief Complaint  Patient presents with  . Hypertension  . Diabetes    HPI Vincent Wall is a 59 y.o. male here today for follow up.  He has a history of HTN, T2DM, CAD and HLD.   -HTN/CAD:  Current management of HTN with lisinopril/hctz and metoprolol.  He is tolerating this well and BP has remained well controlled.  He did have episode of pain in his upper chest and neck areas with shortness of breath that occured while shoveling at work.  This improved with rest.  He did not require NTG.  He is calling to schedule f/u with cardiologist.  He has not had headache, palpitations, dizziness or vision changes.    He is tolerating pitavastatin well and is taking asa and effient.    -Diabetes currently treated with metformin ER 1000mg  daily.  He has had loose stools daily since starting this.  He would like to discuss additional options.  He denies symptom related to diabetes.    -He continues to use norco as needed for chronic hip and back pain.  Last fill #60 on 08/23/19.  Updated opioid agreement signed 4/21.  UDS completed 4/21.    ROS:  A comprehensive ROS was completed and negative except as noted per HPI    Allergies  Allergen Reactions  . Citalopram Hydrobromide     GI  . Crestor [Rosuvastatin]     Myalgia    . Lipitor [Atorvastatin]     Myalgias  . Metformin And Related Diarrhea  . Nabumetone     GI  . Viagra  [Sildenafil Citrate]     GI upset    Past Medical History:  Diagnosis Date  . Elevated uric acid in blood 06/16/2018  . Heart attack (HCC)   . Heart disease   . Hyperlipidemia   . Hypertension     Past Surgical History:  Procedure Laterality Date  . CORONARY STENT PLACEMENT  05/2010  . ELBOW SURGERY  2010    Social History   Socioeconomic History  . Marital status: Married    Spouse name: Not on file  . Number of children: Not on file  . Years of education: Not on file  .  Highest education level: Not on file  Occupational History  . Not on file  Tobacco Use  . Smoking status: Current Every Day Smoker    Packs/day: 1.00    Years: 20.00    Pack years: 20.00    Types: Cigarettes  . Smokeless tobacco: Never Used  Substance and Sexual Activity  . Alcohol use: Not Currently    Alcohol/week: 1.0 standard drink    Types: 1 Standard drinks or equivalent per week  . Drug use: No  . Sexual activity: Yes    Partners: Female  Other Topics Concern  . Not on file  Social History Narrative  . Not on file   Social Determinants of Health   Financial Resource Strain:   . Difficulty of Paying Living Expenses: Not on file  Food Insecurity:   . Worried About 2011 in the Last Year: Not on file  . Ran Out of Food in the Last Year: Not on file  Transportation Needs:   . Lack of Transportation (Medical): Not on file  . Lack of Transportation (Non-Medical): Not on file  Physical Activity:   . Days of Exercise per Week: Not on file  .  Minutes of Exercise per Session: Not on file  Stress:   . Feeling of Stress : Not on file  Social Connections:   . Frequency of Communication with Friends and Family: Not on file  . Frequency of Social Gatherings with Friends and Family: Not on file  . Attends Religious Services: Not on file  . Active Member of Clubs or Organizations: Not on file  . Attends Banker Meetings: Not on file  . Marital Status: Not on file    Family History  Problem Relation Age of Onset  . Breast cancer Unknown   . Heart attack Unknown   . Diabetes Unknown   . Hyperlipidemia Unknown   . Hypertension Unknown     Health Maintenance  Topic Date Due  . INFLUENZA VACCINE  04/03/2020 (Originally 08/05/2019)  . TETANUS/TDAP  04/23/2020 (Originally 12/06/2018)  . COVID-19 Vaccine (1) 04/23/2020 (Originally 01/05/1970)  . COLONOSCOPY  01/05/2023 (Originally 01/06/2008)  . FOOT EXAM  04/23/2020  . HEMOGLOBIN A1C  04/23/2020  .  OPHTHALMOLOGY EXAM  05/08/2020  . PNEUMOCOCCAL POLYSACCHARIDE VACCINE AGE 70-64 HIGH RISK  Completed  . Hepatitis C Screening  Completed  . HIV Screening  Discontinued     ----------------------------------------------------------------------------------------------------------------------------------------------------------------------------------------------------------------- Physical Exam BP 121/65 (BP Location: Left Arm, Patient Position: Sitting, Cuff Size: Normal)   Pulse 72   Wt 203 lb 12.8 oz (92.4 kg)   SpO2 97%   BMI 30.99 kg/m   Physical Exam Constitutional:      Appearance: Normal appearance.  HENT:     Head: Normocephalic and atraumatic.  Eyes:     General: No scleral icterus. Cardiovascular:     Rate and Rhythm: Normal rate and regular rhythm.  Pulmonary:     Effort: Pulmonary effort is normal.     Breath sounds: Normal breath sounds.  Musculoskeletal:     Cervical back: Neck supple.  Neurological:     General: No focal deficit present.     Mental Status: He is alert.  Psychiatric:        Mood and Affect: Mood normal.        Behavior: Behavior normal.     ------------------------------------------------------------------------------------------------------------------------------------------------------------------------------------------------------------------- Assessment and Plan  Essential hypertension, benign Blood pressure is at goal at for age and co-morbidities.  I recommend continuation of current medications.  In addition they were instructed to follow a low sodium diet with regular exercise to help to maintain adequate control of blood pressure.    CAD (coronary artery disease) Recent episode concerning for stable angina.  He will follow up with cardiology.    Type 2 diabetes mellitus with other specified complication (HCC) Slight improvement in A1c He is not tolerating metformin well.  Changed to jardiance Low carbohydrate diet discussed.   Follow up in 6 months.   Lumbar radiculopathy Stable with norco as needed.   Insomnia He is doing well with temazepam.  Will continue.     Meds ordered this encounter  Medications  . empagliflozin (JARDIANCE) 10 MG TABS tablet    Sig: Take 1 tablet (10 mg total) by mouth daily before breakfast.    Dispense:  90 tablet    Refill:  2  . temazepam (RESTORIL) 15 MG capsule    Sig: Take 1-2 capsules (15-30 mg total) by mouth at bedtime as needed for sleep.    Dispense:  60 capsule    Refill:  5    Call pt when ready    No follow-ups on file.    This visit  occurred during the SARS-CoV-2 public health emergency.  Safety protocols were in place, including screening questions prior to the visit, additional usage of staff PPE, and extensive cleaning of exam room while observing appropriate contact time as indicated for disinfecting solutions.

## 2019-10-24 NOTE — Assessment & Plan Note (Signed)
Blood pressure is at goal at for age and co-morbidities.  I recommend continuation of current medications.  In addition they were instructed to follow a low sodium diet with regular exercise to help to maintain adequate control of blood pressure.  ? ?

## 2019-10-24 NOTE — Assessment & Plan Note (Signed)
Stable with norco as needed.

## 2019-10-24 NOTE — Assessment & Plan Note (Signed)
Recent episode concerning for stable angina.  He will follow up with cardiology.

## 2019-10-24 NOTE — Assessment & Plan Note (Addendum)
Slight improvement in A1c He is not tolerating metformin well.  Changed to jardiance Low carbohydrate diet discussed.  Follow up in 6 months.

## 2019-10-24 NOTE — Assessment & Plan Note (Signed)
He is doing well with temazepam.  Will continue.

## 2020-02-08 ENCOUNTER — Other Ambulatory Visit: Payer: Self-pay | Admitting: Family Medicine

## 2020-04-10 ENCOUNTER — Other Ambulatory Visit: Payer: Self-pay | Admitting: Family Medicine

## 2020-04-17 ENCOUNTER — Other Ambulatory Visit: Payer: Self-pay | Admitting: Family Medicine

## 2020-04-19 ENCOUNTER — Other Ambulatory Visit: Payer: Self-pay | Admitting: Family Medicine

## 2020-04-24 ENCOUNTER — Encounter: Payer: Self-pay | Admitting: Family Medicine

## 2020-04-24 ENCOUNTER — Other Ambulatory Visit: Payer: Self-pay

## 2020-04-24 ENCOUNTER — Ambulatory Visit: Payer: 59 | Admitting: Family Medicine

## 2020-04-24 VITALS — BP 131/66 | HR 62 | Temp 98.4°F | Ht 68.0 in | Wt 208.0 lb

## 2020-04-24 DIAGNOSIS — E1169 Type 2 diabetes mellitus with other specified complication: Secondary | ICD-10-CM | POA: Diagnosis not present

## 2020-04-24 DIAGNOSIS — F5101 Primary insomnia: Secondary | ICD-10-CM

## 2020-04-24 DIAGNOSIS — I10 Essential (primary) hypertension: Secondary | ICD-10-CM

## 2020-04-24 DIAGNOSIS — M25552 Pain in left hip: Secondary | ICD-10-CM

## 2020-04-24 DIAGNOSIS — M5416 Radiculopathy, lumbar region: Secondary | ICD-10-CM

## 2020-04-24 DIAGNOSIS — Z5181 Encounter for therapeutic drug level monitoring: Secondary | ICD-10-CM

## 2020-04-24 DIAGNOSIS — E782 Mixed hyperlipidemia: Secondary | ICD-10-CM | POA: Diagnosis not present

## 2020-04-24 DIAGNOSIS — Z720 Tobacco use: Secondary | ICD-10-CM

## 2020-04-24 DIAGNOSIS — M25859 Other specified joint disorders, unspecified hip: Secondary | ICD-10-CM

## 2020-04-24 DIAGNOSIS — Z23 Encounter for immunization: Secondary | ICD-10-CM | POA: Diagnosis not present

## 2020-04-24 DIAGNOSIS — M5442 Lumbago with sciatica, left side: Secondary | ICD-10-CM

## 2020-04-24 MED ORDER — TEMAZEPAM 15 MG PO CAPS: 15.0000 mg | ORAL_CAPSULE | Freq: Every evening | ORAL | 5 refills | Status: DC | PRN

## 2020-04-24 MED ORDER — HYDROCODONE-ACETAMINOPHEN 10-325 MG PO TABS: 1.0000 | ORAL_TABLET | Freq: Three times a day (TID) | ORAL | 0 refills | Status: DC | PRN

## 2020-04-24 MED ORDER — METOPROLOL SUCCINATE ER 50 MG PO TB24
50.0000 mg | ORAL_TABLET | Freq: Every day | ORAL | 3 refills | Status: DC
Start: 2020-04-24 — End: 2020-10-24

## 2020-04-24 NOTE — Assessment & Plan Note (Signed)
He has cut back some.  Counseled on smoking cessation.

## 2020-04-24 NOTE — Assessment & Plan Note (Signed)
Livalo no longer covered by insurance.  He has intolerance to other statins.  Does not want to try injectables but he will discuss with cardiologist.

## 2020-04-24 NOTE — Assessment & Plan Note (Signed)
Temazepam is working well for him.  No side effects.  Will continue at current strength.

## 2020-04-24 NOTE — Assessment & Plan Note (Signed)
He is not monitoring glucose and has discontinued jardiance.  Update a1c and renal function.   Foot exam completed

## 2020-04-24 NOTE — Patient Instructions (Signed)
Have labs completed.  Follow up with cardiology See me again in 6 months.

## 2020-04-24 NOTE — Progress Notes (Signed)
Vincent Wall - 60 y.o. male MRN [id number]  Date of birth: [birth year]  Subjective Chief Complaint  Patient presents with  . Diabetes    HPI Vincent Wall is a 60 y.o. male here today for follow up visit.  He has history of HTN, T2DM, CAD, lumbar radiculopathy and COPD.    State that he feels well.  He is no longer taking pitavasatin as he reports that his insurance is no longer covering this.  He has had intolerance to all other statins.  He has not wanted to try injectable medications.  He states he will talk with his cardiologist about this.    He is not monitoring blood sugars.  He reports that he stopped jardiance because he felt like it was causing GI upset.  He continues on metformin.  He denies symptoms related to diabetes.  He is taking antihypertensives.  No side effects noted.  He has not had any anginal symptoms, headache or vision changes.   He uses norco as needed for radicular low back pain.  He only uses this for flares and not on a daily basis.  No side effects from this.    He continues to use temazepam each night for sleep.  Doing well and denies over-sedation with this.    ROS:  A comprehensive ROS was completed and negative except as noted per HPI  Allergies  Allergen Reactions  . Jardiance [Empagliflozin] Diarrhea  . Citalopram Hydrobromide     GI  . Crestor [Rosuvastatin]     Myalgia    . Lipitor [Atorvastatin]     Myalgias  . Metformin And Related Diarrhea  . Nabumetone     GI  . Viagra  [Sildenafil Citrate]     GI upset    Past Medical History:  Diagnosis Date  . Elevated uric acid in blood 06/16/2018  . Heart attack (HCC)   . Heart disease   . Hyperlipidemia   . Hypertension     Past Surgical History:  Procedure Laterality Date  . CORONARY STENT PLACEMENT  05/2010  . ELBOW SURGERY  2010    Social History   Socioeconomic History  . Marital status: Married    Spouse name: Not on file  . Number of children: Not on file  . Years of  education: Not on file  . Highest education level: Not on file  Occupational History  . Not on file  Tobacco Use  . Smoking status: Current Every Day Smoker    Packs/day: 1.00    Years: 20.00    Pack years: 20.00    Types: Cigarettes  . Smokeless tobacco: Never Used  Substance and Sexual Activity  . Alcohol use: Not Currently    Alcohol/week: 1.0 standard drink    Types: 1 Standard drinks or equivalent per week  . Drug use: No  . Sexual activity: Yes    Partners: Female  Other Topics Concern  . Not on file  Social History Narrative  . Not on file   Social Determinants of Health   Financial Resource Strain: Not on file  Food Insecurity: Not on file  Transportation Needs: Not on file  Physical Activity: Not on file  Stress: Not on file  Social Connections: Not on file    Family History  Problem Relation Age of Onset  . Breast cancer Unknown   . Heart attack Unknown   . Diabetes Unknown   . Hyperlipidemia Unknown   . Hypertension Unknown     Health Maintenance  Topic Date Due  . FOOT EXAM  04/23/2020  . HEMOGLOBIN A1C  04/23/2020  . COLONOSCOPY (Pts 45-30yrs Insurance coverage will need to be confirmed)  01/05/2023 (Originally 01/06/2003)  . COVID-19 Vaccine (3 - Booster for Moderna series) 05/05/2020  . OPHTHALMOLOGY EXAM  05/08/2020  . INFLUENZA VACCINE  08/04/2020  . TETANUS/TDAP  04/25/2030  . PNEUMOCOCCAL POLYSACCHARIDE VACCINE AGE 91-64 HIGH RISK  Completed  . Hepatitis C Screening  Completed  . HPV VACCINES  Aged Out  . HIV Screening  Discontinued     ----------------------------------------------------------------------------------------------------------------------------------------------------------------------------------------------------------------- Physical Exam BP 131/66 (BP Location: Left Arm, Patient Position: Sitting, Cuff Size: Normal)   Pulse 62   Temp 98.4 F (36.9 C) (Oral)   Ht 5\' 8"  (1.727 m)   Wt 208 lb 0.6 oz (94.4 kg)   BMI  31.63 kg/m   Physical Exam Constitutional:      Appearance: Normal appearance.  HENT:     Head: Normocephalic and atraumatic.  Eyes:     General: No scleral icterus. Cardiovascular:     Rate and Rhythm: Normal rate and regular rhythm.  Pulmonary:     Effort: Pulmonary effort is normal.     Breath sounds: Normal breath sounds.  Musculoskeletal:     Cervical back: Neck supple.  Skin:    General: Skin is warm and dry.  Neurological:     General: No focal deficit present.     Mental Status: He is alert.  Psychiatric:        Mood and Affect: Mood normal.     ------------------------------------------------------------------------------------------------------------------------------------------------------------------------------------------------------------------- Assessment and Plan  Type 2 diabetes mellitus with other specified complication (HCC) He is not monitoring glucose and has discontinued jardiance.  Update a1c and renal function.   Foot exam completed  Lumbar radiculopathy Uses norco as needed for flares.  Updated UDS ordered.  Opioid agreement signed 04/2019 PDMP reviewed.    Hyperlipidemia Livalo no longer covered by insurance.  He has intolerance to other statins.  Does not want to try injectables but he will discuss with cardiologist.   Insomnia Temazepam is working well for him.  No side effects.  Will continue at current strength.    Tobacco use He has cut back some.  Counseled on smoking cessation.    Essential hypertension, benign Blood pressure is at goal at for age and co-morbidities.  I recommend continuation of current medications.  In addition they were instructed to follow a low sodium diet with regular exercise to help to maintain adequate control of blood pressure.     Meds ordered this encounter  Medications  . metoprolol succinate (TOPROL-XL) 50 MG 24 hr tablet    Sig: Take 1 tablet (50 mg total) by mouth daily. Take with or  immediately following a meal.    Dispense:  90 tablet    Refill:  3  . temazepam (RESTORIL) 15 MG capsule    Sig: Take 1-2 capsules (15-30 mg total) by mouth at bedtime as needed for sleep.    Dispense:  60 capsule    Refill:  5    Call pt when ready  . HYDROcodone-acetaminophen (NORCO) 10-325 MG tablet    Sig: Take 1 tablet by mouth every 8 (eight) hours as needed.    Dispense:  60 tablet    Refill:  0    Call pt when ready    Return in about 6 months (around 10/24/2020) for HTN/DM.    This visit occurred during the SARS-CoV-2 public health emergency.  Safety  protocols were in place, including screening questions prior to the visit, additional usage of staff PPE, and extensive cleaning of exam room while observing appropriate contact time as indicated for disinfecting solutions.

## 2020-04-24 NOTE — Assessment & Plan Note (Signed)
Blood pressure is at goal at for age and co-morbidities.  I recommend continuation of current medications.  In addition they were instructed to follow a low sodium diet with regular exercise to help to maintain adequate control of blood pressure.  ? ?

## 2020-04-24 NOTE — Assessment & Plan Note (Signed)
Uses norco as needed for flares.  Updated UDS ordered.  Opioid agreement signed 04/2019 PDMP reviewed.

## 2020-04-25 ENCOUNTER — Other Ambulatory Visit: Payer: Self-pay | Admitting: Family Medicine

## 2020-04-25 ENCOUNTER — Telehealth: Payer: Self-pay

## 2020-04-25 LAB — CBC WITH DIFFERENTIAL
Basophils Absolute: 0.1 10*3/uL (ref 0.0–0.2)
Basos: 1 %
EOS (ABSOLUTE): 0.2 10*3/uL (ref 0.0–0.4)
Eos: 3 %
Hematocrit: 43.9 % (ref 37.5–51.0)
Hemoglobin: 14.2 g/dL (ref 13.0–17.7)
Immature Grans (Abs): 0 10*3/uL (ref 0.0–0.1)
Immature Granulocytes: 0 %
Lymphocytes Absolute: 1.9 10*3/uL (ref 0.7–3.1)
Lymphs: 30 %
MCH: 27.5 pg (ref 26.6–33.0)
MCHC: 32.3 g/dL (ref 31.5–35.7)
MCV: 85 fL (ref 79–97)
Monocytes Absolute: 0.3 10*3/uL (ref 0.1–0.9)
Monocytes: 5 %
Neutrophils Absolute: 4 10*3/uL (ref 1.4–7.0)
Neutrophils: 61 %
RBC: 5.16 x10E6/uL (ref 4.14–5.80)
RDW: 13.1 % (ref 11.6–15.4)
WBC: 6.6 10*3/uL (ref 3.4–10.8)

## 2020-04-25 LAB — CMP14+EGFR
ALT: 38 IU/L (ref 0–44)
AST: 19 IU/L (ref 0–40)
Albumin/Globulin Ratio: 1.6 (ref 1.2–2.2)
Albumin: 4.3 g/dL (ref 3.8–4.8)
Alkaline Phosphatase: 96 IU/L (ref 44–121)
BUN/Creatinine Ratio: 12 (ref 10–24)
BUN: 12 mg/dL (ref 8–27)
Bilirubin Total: 0.7 mg/dL (ref 0.0–1.2)
CO2: 23 mmol/L (ref 20–29)
Calcium: 9.5 mg/dL (ref 8.6–10.2)
Chloride: 98 mmol/L (ref 96–106)
Creatinine, Ser: 1.03 mg/dL (ref 0.76–1.27)
Globulin, Total: 2.7 g/dL (ref 1.5–4.5)
Glucose: 171 mg/dL — ABNORMAL HIGH (ref 65–99)
Potassium: 4.3 mmol/L (ref 3.5–5.2)
Sodium: 139 mmol/L (ref 134–144)
Total Protein: 7 g/dL (ref 6.0–8.5)
eGFR: 82 mL/min/{1.73_m2} (ref >59)

## 2020-04-25 LAB — LIPID PANEL
Chol/HDL Ratio: 6.8 ratio — ABNORMAL HIGH (ref 0.0–5.0)
Cholesterol, Total: 223 mg/dL — ABNORMAL HIGH (ref 100–199)
HDL: 33 mg/dL — ABNORMAL LOW (ref >39)
LDL Chol Calc (NIH): 148 mg/dL — ABNORMAL HIGH (ref 0–99)
Triglycerides: 230 mg/dL — ABNORMAL HIGH (ref 0–149)
VLDL Cholesterol Cal: 42 mg/dL — ABNORMAL HIGH (ref 5–40)

## 2020-04-25 LAB — HEMOGLOBIN A1C
Est. average glucose Bld gHb Est-mCnc: 166 mg/dL
Hgb A1c MFr Bld: 7.4 % — ABNORMAL HIGH (ref 4.8–5.6)

## 2020-04-25 MED ORDER — NEOMYCIN-POLYMYXIN-HC 3.5-10000-1 OT SUSP: 4.0000 [drp] | Freq: Three times a day (TID) | OTIC | 0 refills | Status: DC

## 2020-04-25 NOTE — Telephone Encounter (Signed)
Pt lvm requesting medication for ear ache.   Pt has been advised medication sent to pharmacy.

## 2020-05-03 LAB — TOXASSURE SELECT 13 (MW), URINE

## 2020-05-13 ENCOUNTER — Other Ambulatory Visit: Payer: Self-pay | Admitting: Family Medicine

## 2020-06-20 ENCOUNTER — Other Ambulatory Visit: Payer: Self-pay | Admitting: Family Medicine

## 2020-06-20 ENCOUNTER — Telehealth: Payer: Self-pay

## 2020-06-20 MED ORDER — DAPAGLIFLOZIN PROPANEDIOL 10 MG PO TABS: 10.0000 mg | ORAL_TABLET | Freq: Every day | ORAL | 3 refills | Status: DC

## 2020-06-20 NOTE — Telephone Encounter (Signed)
Vincent Wall lvm requesting diabetic medication be changed from Metformin due to continued diarrhea.  Msg sent to Dr. Ashley Royalty for advise.

## 2020-06-24 ENCOUNTER — Telehealth: Payer: Self-pay

## 2020-06-24 NOTE — Telephone Encounter (Signed)
Pt lvm stating Farxiga copay was $300+.   Also, advised he stopped the Metformin and the diarrhea has resolved.   LVM advising pt we have a coupon available for pick-up at the [employer] that should help with the copay situation.

## 2020-07-01 ENCOUNTER — Other Ambulatory Visit: Payer: Self-pay | Admitting: Family Medicine

## 2020-09-09 ENCOUNTER — Telehealth: Payer: Self-pay

## 2020-09-09 NOTE — Telephone Encounter (Signed)
Transition Care Management Unsuccessful Follow-up Telephone Call  Date of discharge and from where:  09/05/2020 from [facility]  Attempts:  1st Attempt  Reason for unsuccessful TCM follow-up call:  Left voice message    

## 2020-09-09 NOTE — Telephone Encounter (Signed)
Transition Care Management Follow-up Telephone Call Date of discharge and from where: 09/05/2020 from [facility] How have you been since you were released from the hospital? Pt stated that he is feeling okay. He has to have triple bypass surgery this week.  Any questions or concerns? No  Items Reviewed: Did the pt receive and understand the discharge instructions provided? Yes  Medications obtained and verified? Yes  Other? No  Any new allergies since your discharge? No  Dietary orders reviewed? No Do you have support at home? Yes   Functional Questionnaire: (I = Independent and D = Dependent) ADLs: I  Bathing/Dressing- I  Meal Prep- I  Eating- I  Maintaining continence- I  Transferring/Ambulation- I  Managing Meds- I   Follow up appointments reviewed:  PCP Hospital f/u appt confirmed? No   Specialist Hospital f/u appt confirmed? No  Follow up with Cards and Surgeon Are transportation arrangements needed? No  If their condition worsens, is the pt aware to call PCP or go to the Emergency Dept.? Yes Was the patient provided with contact information for the PCP's office or ED? Yes Was to pt encouraged to call back with questions or concerns? Yes

## 2020-09-18 LAB — HEMOGLOBIN A1C: Hemoglobin A1C: 7.8

## 2020-09-19 DIAGNOSIS — Z951 Presence of aortocoronary bypass graft: Secondary | ICD-10-CM | POA: Insufficient documentation

## 2020-09-23 ENCOUNTER — Other Ambulatory Visit: Payer: Self-pay | Admitting: Family Medicine

## 2020-09-24 ENCOUNTER — Telehealth: Payer: Self-pay | Admitting: General Practice

## 2020-09-24 NOTE — Telephone Encounter (Signed)
Transition Care Management Unsuccessful Follow-up Telephone Call  Date of discharge and from where:  09/23/20 from [facility]  Attempts:  1st Attempt  Reason for unsuccessful TCM follow-up call:  Left voice message    

## 2020-09-26 NOTE — Telephone Encounter (Signed)
Transition Care Management Unsuccessful Follow-up Telephone Call  Date of discharge and from where:  09/23/20 from [facility]  Attempts:  2nd Attempt  Reason for unsuccessful TCM follow-up call:  Left voice message    

## 2020-09-27 ENCOUNTER — Other Ambulatory Visit: Payer: Self-pay | Admitting: Family Medicine

## 2020-09-29 NOTE — Telephone Encounter (Signed)
Pt lvm stating he had open heart surgery last week. Requesting smoking cessation patches due to smoking 2 packs of cigarettes per day.

## 2020-09-29 NOTE — Telephone Encounter (Signed)
Transition Care Management Unsuccessful Follow-up Telephone Call  Date of discharge and from where:  09/23/20 from [facility]  Attempts:  3rd Attempt  Reason for unsuccessful TCM follow-up call:  Left voice message

## 2020-10-02 ENCOUNTER — Other Ambulatory Visit: Payer: Self-pay | Admitting: Family Medicine

## 2020-10-02 MED ORDER — NICOTINE 21 MG/24HR TD PT24: 21.0000 mg | MEDICATED_PATCH | Freq: Every day | TRANSDERMAL | 0 refills | Status: DC

## 2020-10-02 MED ORDER — NICOTINE 7 MG/24HR TD PT24: 7.0000 mg | MEDICATED_PATCH | Freq: Every day | TRANSDERMAL | 0 refills | Status: DC

## 2020-10-02 MED ORDER — NICOTINE 14 MG/24HR TD PT24: 14.0000 mg | MEDICATED_PATCH | Freq: Every day | TRANSDERMAL | 0 refills | Status: AC

## 2020-10-14 ENCOUNTER — Other Ambulatory Visit: Payer: Self-pay | Admitting: Sports Medicine

## 2020-10-14 DIAGNOSIS — M25552 Pain in left hip: Secondary | ICD-10-CM

## 2020-10-14 DIAGNOSIS — M5442 Lumbago with sciatica, left side: Secondary | ICD-10-CM

## 2020-10-14 DIAGNOSIS — M25859 Other specified joint disorders, unspecified hip: Secondary | ICD-10-CM

## 2020-10-14 NOTE — Telephone Encounter (Signed)
To PCP

## 2020-10-19 ENCOUNTER — Other Ambulatory Visit: Payer: Self-pay | Admitting: Family Medicine

## 2020-10-24 ENCOUNTER — Other Ambulatory Visit: Payer: Self-pay

## 2020-10-24 ENCOUNTER — Ambulatory Visit (INDEPENDENT_AMBULATORY_CARE_PROVIDER_SITE_OTHER): Payer: 59 | Admitting: Family Medicine

## 2020-10-24 ENCOUNTER — Encounter: Payer: Self-pay | Admitting: Family Medicine

## 2020-10-24 VITALS — BP 161/93 | HR 82 | Ht 68.0 in | Wt 208.0 lb

## 2020-10-24 DIAGNOSIS — E1169 Type 2 diabetes mellitus with other specified complication: Secondary | ICD-10-CM

## 2020-10-24 DIAGNOSIS — E782 Mixed hyperlipidemia: Secondary | ICD-10-CM

## 2020-10-24 DIAGNOSIS — I2510 Atherosclerotic heart disease of native coronary artery without angina pectoris: Secondary | ICD-10-CM

## 2020-10-24 DIAGNOSIS — Z720 Tobacco use: Secondary | ICD-10-CM | POA: Diagnosis not present

## 2020-10-24 DIAGNOSIS — I10 Essential (primary) hypertension: Secondary | ICD-10-CM | POA: Diagnosis not present

## 2020-10-24 DIAGNOSIS — F5101 Primary insomnia: Secondary | ICD-10-CM

## 2020-10-24 LAB — GLUCOSE, POCT (MANUAL RESULT ENTRY): POC Glucose: 223 mg/dl — AB (ref 70–99)

## 2020-10-24 MED ORDER — PRASUGREL HCL 10 MG PO TABS: 10.0000 mg | ORAL_TABLET | Freq: Every day | ORAL | 0 refills | Status: DC

## 2020-10-24 MED ORDER — VARENICLINE TARTRATE 0.5 MG PO TABS: ORAL_TABLET | ORAL | 0 refills | Status: DC

## 2020-10-24 MED ORDER — LISINOPRIL 10 MG PO TABS: 10.0000 mg | ORAL_TABLET | Freq: Every day | ORAL | 3 refills | Status: DC

## 2020-10-24 MED ORDER — DAPAGLIFLOZIN PROPANEDIOL 10 MG PO TABS: 10.0000 mg | ORAL_TABLET | Freq: Every day | ORAL | 3 refills | Status: DC

## 2020-10-24 MED ORDER — HYDROCODONE-ACETAMINOPHEN 10-325 MG PO TABS: 1.0000 | ORAL_TABLET | Freq: Three times a day (TID) | ORAL | 0 refills | Status: DC | PRN

## 2020-10-24 MED ORDER — TEMAZEPAM 15 MG PO CAPS: 15.0000 mg | ORAL_CAPSULE | Freq: Every evening | ORAL | 5 refills | Status: DC | PRN

## 2020-10-24 NOTE — Patient Instructions (Addendum)
Start lisinopril back at 10mg  daily.  Work on stopping smoking. Chantix has been sent in.  Add farxiga for diabetes.  Stop in for lab work in 1 week.

## 2020-10-26 NOTE — Assessment & Plan Note (Signed)
Diabetes is fairly well controlled.  He will continue Amaryl and will add Farxiga back on.  Instructed to follow a low carbohydrate diet.

## 2020-10-26 NOTE — Assessment & Plan Note (Signed)
Counseled on smoking cessation.  Adding Chantix to help with this.

## 2020-10-26 NOTE — Progress Notes (Signed)
Vincent Wall - 60 y.o. male MRN [id number]  Date of birth: Jan 09, 1960  Subjective Chief Complaint  Patient presents with   Hypertension    HPI Vincent Wall is a 60 year old male here today for follow-up visit.  Since his last visit with me he did have CABG x3.  He reports he is doing well since having this completed.  He has followed up with his surgeon but still has not followed up with his cardiologist.  His surgeon is pleased with his progress so far.  Metoprolol was added to his medication regimen prior to discharge.  His lisinopril was held during his hospitalization.  He was continued on his home pain medication of hydrocodone as needed.  He is requesting a renewal of this.  He remains on Effient.  He realizes he does need to quit smoking and would like to try adding Chantix.  He is not currently on a statin due to previous intolerance.  He will discuss this further with his cardiologist at follow-up.  [name] was added for his diabetes and [name] was discontinued.  He did not tolerate Tradjenta and had quite a bit of diarrhea with this.  He did not tolerate [place] previously.  He is not monitoring blood sugars at this time.  Blood pressure is elevated today.  Currently only taking metoprolol for management of hypertension.  He has been taking lisinopril with hydrochlorothiazide previously  He continues to do well with temazepam as needed for sleep.  ROS:  A comprehensive ROS was completed and negative except as noted per HPI   Allergies  Allergen Reactions   Jardiance [Empagliflozin] Diarrhea   Citalopram Hydrobromide     GI   Crestor [Rosuvastatin]     Myalgia     Lipitor [Atorvastatin]     Myalgias   Metformin And Related Diarrhea   Nabumetone     GI   Viagra  [Sildenafil Citrate]     GI upset    Past Medical History:  Diagnosis Date   Elevated uric acid in blood 06/16/2018   Heart attack (HCC)    Heart disease    Hyperlipidemia    Hypertension     Past Surgical  History:  Procedure Laterality Date   CORONARY STENT PLACEMENT  05/2010   ELBOW SURGERY  2010    Social History   Socioeconomic History   Marital status: Married    Spouse name: Not on file   Number of children: Not on file   Years of education: Not on file   Highest education level: Not on file  Occupational History   Not on file  Tobacco Use   Smoking status: Every Day    Packs/day: 1.00    Years: 20.00    Pack years: 20.00    Types: Cigarettes   Smokeless tobacco: Never  Substance and Sexual Activity   Alcohol use: Not Currently    Alcohol/week: 1.0 standard drink    Types: 1 Standard drinks or equivalent per week   Drug use: No   Sexual activity: Yes    Partners: Female  Other Topics Concern   Not on file  Social History Narrative   Not on file   Social Determinants of Health   Financial Resource Strain: Not on file  Food Insecurity: Not on file  Transportation Needs: Not on file  Physical Activity: Not on file  Stress: Not on file  Social Connections: Not on file    Family History  Problem Relation Age of Onset   Breast  cancer Unknown    Heart attack Unknown    Diabetes Unknown    Hyperlipidemia Unknown    Hypertension Unknown     Health Maintenance  Topic Date Due   Zoster Vaccines- Shingrix (1 of 2) Never done   Pneumococcal Vaccine 64-55 Years old (2 - PCV) 10/27/2011   COVID-19 Vaccine (3 - Booster for Moderna series) 01/01/2020   OPHTHALMOLOGY EXAM  05/08/2020   INFLUENZA VACCINE  08/04/2020   COLONOSCOPY (Pts 45-20yrs Insurance coverage will need to be confirmed)  01/05/2023 (Originally 01/06/2003)   HEMOGLOBIN A1C  03/18/2021   FOOT EXAM  04/24/2021   TETANUS/TDAP  04/25/2030   Hepatitis C Screening  Completed   HPV VACCINES  Aged Out   HIV Screening  Discontinued      ----------------------------------------------------------------------------------------------------------------------------------------------------------------------------------------------------------------- Physical Exam BP (!) 161/93 (BP Location: Left Arm, Patient Position: Sitting, Cuff Size: Normal)   Pulse 82   Ht 5\' 8"  (1.727 m)   Wt 208 lb (94.3 kg)   SpO2 97%   BMI 31.63 kg/m   Physical Exam Constitutional:      Appearance: Normal appearance.  Eyes:     General: No scleral icterus. Cardiovascular:     Rate and Rhythm: Normal rate and regular rhythm.     Comments: Median sternotomy scar healing well. Pulmonary:     Effort: Pulmonary effort is normal.     Breath sounds: Normal breath sounds.  Musculoskeletal:     Cervical back: Neck supple.  Skin:    General: Skin is warm and dry.  Neurological:     General: No focal deficit present.     Mental Status: He is alert.  Psychiatric:        Mood and Affect: Mood normal.        Behavior: Behavior normal.    ------------------------------------------------------------------------------------------------------------------------------------------------------------------------------------------------------------------- Assessment and Plan  CAD (coronary artery disease) Status post recent CABG x3.  He will continue to follow with cardiology for continued management of his coronary artery disease.  We will continue to work on modifying risk factors.  Blood pressure is elevated today.  Adding lisinopril back on in addition to metoprolol.  I discussed that we will need to check his renal function again in about 1 week.  Orders placed for these labs.  He will continue on Effient for antiplatelet therapy.  Tobacco use Counseled on smoking cessation.  Adding Chantix to help with this.  Insomnia He will continue temazepam as needed.  Hyperlipidemia Intolerant to statins.  He will discuss this further with his  cardiologist.  Can consider adding PCSK9 inhibitor.  Type 2 diabetes mellitus with other specified complication (HCC) Diabetes is fairly well controlled.  He will continue Amaryl and will add Farxiga back on.  Instructed to follow a low carbohydrate diet.   Meds ordered this encounter  Medications   HYDROcodone-acetaminophen (NORCO) 10-325 MG tablet    Sig: Take 1 tablet by mouth every 8 (eight) hours as needed.    Dispense:  60 tablet    Refill:  0    Call pt when ready   prasugrel (EFFIENT) 10 MG TABS tablet    Sig: Take 1 tablet (10 mg total) by mouth daily.    Dispense:  90 tablet    Refill:  0   temazepam (RESTORIL) 15 MG capsule    Sig: Take 1-2 capsules (15-30 mg total) by mouth at bedtime as needed for sleep.    Dispense:  60 capsule    Refill:  5  Call pt when ready   dapagliflozin propanediol (FARXIGA) 10 MG TABS tablet    Sig: Take 1 tablet (10 mg total) by mouth daily before breakfast.    Dispense:  90 tablet    Refill:  3   lisinopril (ZESTRIL) 10 MG tablet    Sig: Take 1 tablet (10 mg total) by mouth daily.    Dispense:  90 tablet    Refill:  3   varenicline (CHANTIX) 0.5 MG tablet    Sig: Take 0.5mg  daily x3 days, then 0.5mg  twice daily for days 4-7, then 1mg  BID    Dispense:  53 tablet    Refill:  0    Return in about 3 months (around 01/24/2021) for HTN.    This visit occurred during the SARS-CoV-2 public health emergency.  Safety protocols were in place, including screening questions prior to the visit, additional usage of staff PPE, and extensive cleaning of exam room while observing appropriate contact time as indicated for disinfecting solutions.

## 2020-10-26 NOTE — Assessment & Plan Note (Signed)
Status post recent CABG x3.  He will continue to follow with cardiology for continued management of his coronary artery disease.  We will continue to work on modifying risk factors.  Blood pressure is elevated today.  Adding lisinopril back on in addition to metoprolol.  I discussed that we will need to check his renal function again in about 1 week.  Orders placed for these labs.  He will continue on Effient for antiplatelet therapy.

## 2020-10-26 NOTE — Assessment & Plan Note (Signed)
Intolerant to statins.  He will discuss this further with his cardiologist.  Can consider adding PCSK9 inhibitor.

## 2020-10-26 NOTE — Assessment & Plan Note (Signed)
He will continue temazepam as needed.

## 2020-11-05 LAB — BASIC METABOLIC PANEL
BUN/Creatinine Ratio: 14 (ref 10–24)
BUN: 14 mg/dL (ref 8–27)
CO2: 24 mmol/L (ref 20–29)
Calcium: 9.5 mg/dL (ref 8.6–10.2)
Chloride: 102 mmol/L (ref 96–106)
Creatinine, Ser: 1.01 mg/dL (ref 0.76–1.27)
Glucose: 188 mg/dL — ABNORMAL HIGH (ref 70–99)
Potassium: 4.6 mmol/L (ref 3.5–5.2)
Sodium: 137 mmol/L (ref 134–144)
eGFR: 84 mL/min/{1.73_m2} (ref >59)

## 2020-11-06 ENCOUNTER — Telehealth: Payer: Self-pay

## 2020-11-06 NOTE — Telephone Encounter (Signed)
Medication: varenicline (CHANTIX) 0.5 MG tablet Prior authorization submitted via CoverMyMeds on 11/06/2020 PA submission pending

## 2021-01-15 ENCOUNTER — Other Ambulatory Visit: Payer: Self-pay | Admitting: Family Medicine

## 2021-01-15 DIAGNOSIS — I10 Essential (primary) hypertension: Secondary | ICD-10-CM

## 2021-01-17 ENCOUNTER — Other Ambulatory Visit: Payer: Self-pay | Admitting: Family Medicine

## 2021-01-20 ENCOUNTER — Other Ambulatory Visit: Payer: Self-pay | Admitting: Family Medicine

## 2021-01-26 ENCOUNTER — Ambulatory Visit: Payer: Managed Care, Other (non HMO) | Admitting: Family Medicine

## 2021-01-26 ENCOUNTER — Encounter: Payer: Self-pay | Admitting: Family Medicine

## 2021-01-26 ENCOUNTER — Other Ambulatory Visit: Payer: Self-pay

## 2021-01-26 VITALS — BP 137/77 | HR 74 | Temp 97.7°F | Ht 68.0 in | Wt 206.0 lb

## 2021-01-26 DIAGNOSIS — E1169 Type 2 diabetes mellitus with other specified complication: Secondary | ICD-10-CM

## 2021-01-26 DIAGNOSIS — I10 Essential (primary) hypertension: Secondary | ICD-10-CM | POA: Diagnosis not present

## 2021-01-26 DIAGNOSIS — G629 Polyneuropathy, unspecified: Secondary | ICD-10-CM

## 2021-01-26 DIAGNOSIS — E1140 Type 2 diabetes mellitus with diabetic neuropathy, unspecified: Secondary | ICD-10-CM

## 2021-01-26 DIAGNOSIS — I2510 Atherosclerotic heart disease of native coronary artery without angina pectoris: Secondary | ICD-10-CM

## 2021-01-26 DIAGNOSIS — Z720 Tobacco use: Secondary | ICD-10-CM

## 2021-01-26 DIAGNOSIS — E782 Mixed hyperlipidemia: Secondary | ICD-10-CM

## 2021-01-26 LAB — POCT GLYCOSYLATED HEMOGLOBIN (HGB A1C): HbA1c, POC (controlled diabetic range): 9.4 % — AB (ref 0.0–7.0)

## 2021-01-26 MED ORDER — LISINOPRIL-HYDROCHLOROTHIAZIDE 20-12.5 MG PO TABS: 1.0000 | ORAL_TABLET | Freq: Every day | ORAL | 1 refills | Status: DC

## 2021-01-26 MED ORDER — OZEMPIC (0.25 OR 0.5 MG/DOSE) 2 MG/1.5ML ~~LOC~~ SOPN: PEN_INJECTOR | SUBCUTANEOUS | 3 refills | Status: DC

## 2021-01-26 MED ORDER — ALLOPURINOL 100 MG PO TABS: 100.0000 mg | ORAL_TABLET | Freq: Every day | ORAL | 1 refills | Status: DC

## 2021-01-26 MED ORDER — VARENICLINE TARTRATE 0.5 MG PO TABS: ORAL_TABLET | ORAL | 0 refills | Status: DC

## 2021-01-26 MED ORDER — PRASUGREL HCL 10 MG PO TABS: 10.0000 mg | ORAL_TABLET | Freq: Every day | ORAL | 1 refills | Status: DC

## 2021-01-26 NOTE — Assessment & Plan Note (Signed)
He was counseled on smoking cessation.  Adding Chantix.

## 2021-01-26 NOTE — Assessment & Plan Note (Signed)
Blood pressure is well controlled at this time.  Recommend continuation of current antihypertensive medications.  Low-sodium diet encouraged.  Encouraged to quit smoking.

## 2021-01-26 NOTE — Progress Notes (Signed)
Vincent Wall - 61 y.o. male MRN [id number]  Date of birth: 12-19-1960  Subjective Chief Complaint  Patient presents with   Hypertension   Diabetes    HPI Vincent Wall is a 62 y.o. male here today for follow up visit.    Continues to have difficulty with quitting smoking.  Unsuccessful with patches. Has new insurance and would like to try chantix.   Continues to see cardiology for management of CAD.  Denies anginal symptoms or dyspnea since having CABG in fall of last year.  He has been intolerant to statins   He is not tolerating medications for diabetes well.  Reports having diarrhea with farxiga and glimepiride.  Would be interested in trying GLP-1.  I have some increased neuropathic symptoms.  Has gabapentin at home.  ROS:  A comprehensive ROS was completed and negative except as noted per HPI  Allergies  Allergen Reactions   Jardiance [Empagliflozin] Diarrhea   Citalopram Hydrobromide     GI   Crestor [Rosuvastatin]     Myalgia     Lipitor [Atorvastatin]     Myalgias   Metformin And Related Diarrhea   Nabumetone     GI   Viagra  [Sildenafil Citrate]     GI upset    Past Medical History:  Diagnosis Date   Elevated uric acid in blood 06/16/2018   Heart attack (HCC)    Heart disease    Hyperlipidemia    Hypertension     Past Surgical History:  Procedure Laterality Date   CORONARY STENT PLACEMENT  05/2010   ELBOW SURGERY  2010    Social History   Socioeconomic History   Marital status: Married    Spouse name: Not on file   Number of children: Not on file   Years of education: Not on file   Highest education level: Not on file  Occupational History   Not on file  Tobacco Use   Smoking status: Every Day    Packs/day: 1.00    Years: 20.00    Pack years: 20.00    Types: Cigarettes   Smokeless tobacco: Never  Substance and Sexual Activity   Alcohol use: Not Currently    Alcohol/week: 1.0 standard drink    Types: 1 Standard drinks or equivalent per week    Drug use: No   Sexual activity: Yes    Partners: Female  Other Topics Concern   Not on file  Social History Narrative   Not on file   Social Determinants of Health   Financial Resource Strain: Not on file  Food Insecurity: Not on file  Transportation Needs: Not on file  Physical Activity: Not on file  Stress: Not on file  Social Connections: Not on file    Family History  Problem Relation Age of Onset   Breast cancer Unknown    Heart attack Unknown    Diabetes Unknown    Hyperlipidemia Unknown    Hypertension Unknown     Health Maintenance  Topic Date Due   COVID-19 Vaccine (3 - Booster for Moderna series) 01/01/2020   OPHTHALMOLOGY EXAM  05/08/2020   INFLUENZA VACCINE  04/03/2021 (Originally 08/04/2020)   Zoster Vaccines- Shingrix (1 of 2) 04/26/2021 (Originally 01/06/2008)   COLONOSCOPY (Pts 45-49yrs Insurance coverage will need to be confirmed)  01/05/2023 (Originally 01/06/2003)   FOOT EXAM  04/24/2021   HEMOGLOBIN A1C  07/26/2021   TETANUS/TDAP  04/25/2030   Hepatitis C Screening  Completed   HPV VACCINES  Aged Out  HIV Screening  Discontinued     ----------------------------------------------------------------------------------------------------------------------------------------------------------------------------------------------------------------- Physical Exam BP 137/77    Pulse 74    Temp 97.7 F (36.5 C)    Ht 5\' 8"  (1.727 m)    Wt 206 lb (93.4 kg)    SpO2 98%    BMI 31.32 kg/m   Physical Exam Constitutional:      Appearance: Normal appearance.  Eyes:     General: No scleral icterus. Cardiovascular:     Rate and Rhythm: Normal rate and regular rhythm.  Pulmonary:     Effort: Pulmonary effort is normal.     Breath sounds: Normal breath sounds.  Musculoskeletal:     Cervical back: Neck supple.  Neurological:     General: No focal deficit present.     Mental Status: He is alert.  Psychiatric:        Mood and Affect: Mood normal.         Behavior: Behavior normal.    ------------------------------------------------------------------------------------------------------------------------------------------------------------------------------------------------------------------- Assessment and Plan  Essential hypertension, benign Blood pressure is well controlled at this time.  Recommend continuation of current antihypertensive medications.  Low-sodium diet encouraged.  Encouraged to quit smoking.  CAD (coronary artery disease) Status post CABG.  He will continue management per cardiology.  Type 2 diabetes mellitus with diabetic neuropathy, unspecified (HCC) Diabetes is poorly controlled at this time.  He has been intolerant of several medications for management of his diabetes, citing diarrhea as his intolerance.  Adding Ozempic.  Will titrate over the next few months as tolerated.  Continue to work on low carbohydrate diet.  He may increase his gabapentin as well for management of his neuropathic symptoms.  Tobacco use He was counseled on smoking cessation.  Adding Chantix.  Hyperlipidemia He has been intolerant to several statins except for Livalo however this is not covered with his insurance.  Working with cardiology to find a solution for management of lipids.   Meds ordered this encounter  Medications   prasugrel (EFFIENT) 10 MG TABS tablet    Sig: Take 1 tablet (10 mg total) by mouth daily.    Dispense:  90 tablet    Refill:  1   lisinopril-hydrochlorothiazide (ZESTORETIC) 20-12.5 MG tablet    Sig: Take 1 tablet by mouth daily.    Dispense:  90 tablet    Refill:  1   allopurinol (ZYLOPRIM) 100 MG tablet    Sig: Take 1 tablet (100 mg total) by mouth daily.    Dispense:  90 tablet    Refill:  1   Semaglutide,0.25 or 0.5MG /DOS, (OZEMPIC, 0.25 OR 0.5 MG/DOSE,) 2 MG/1.5ML SOPN    Sig: Start 0.25mg  weekly x1 month then increase to 0.5mg  weekly.    Dispense:  3 mL    Refill:  3   varenicline (CHANTIX) 0.5 MG  tablet    Sig: Take 0.5mg  daily x3 days, then 0.5mg  twice daily for days 4-7, then 1mg  BID    Dispense:  53 tablet    Refill:  0    Return in about 3 months (around 04/26/2021) for T2DM.    This visit occurred during the SARS-CoV-2 public health emergency.  Safety protocols were in place, including screening questions prior to the visit, additional usage of staff PPE, and extensive cleaning of exam room while observing appropriate contact time as indicated for disinfecting solutions.

## 2021-01-26 NOTE — Patient Instructions (Signed)
You can increase gabapentin to three capsules at bedtime.  Have labs completed.

## 2021-01-26 NOTE — Assessment & Plan Note (Signed)
Status post CABG.  He will continue management per cardiology.

## 2021-01-26 NOTE — Assessment & Plan Note (Signed)
Diabetes is poorly controlled at this time.  He has been intolerant of several medications for management of his diabetes, citing diarrhea as his intolerance.  Adding Ozempic.  Will titrate over the next few months as tolerated.  Continue to work on low carbohydrate diet.  He may increase his gabapentin as well for management of his neuropathic symptoms.

## 2021-01-26 NOTE — Assessment & Plan Note (Addendum)
He has been intolerant to several statins except for Livalo however this is not covered with his insurance.  Working with cardiology to find a solution for management of lipids.

## 2021-01-27 LAB — VITAMIN B12: Vitamin B-12: 259 pg/mL (ref 232–1245)

## 2021-01-27 LAB — IRON,TIBC AND FERRITIN PANEL
Ferritin: 45 ng/mL (ref 30–400)
Iron Saturation: 20 % (ref 15–55)
Iron: 61 ug/dL (ref 38–169)
Total Iron Binding Capacity: 310 ug/dL (ref 250–450)
UIBC: 249 ug/dL (ref 111–343)

## 2021-01-28 ENCOUNTER — Other Ambulatory Visit: Payer: Self-pay

## 2021-01-28 ENCOUNTER — Other Ambulatory Visit: Payer: Self-pay | Admitting: Family Medicine

## 2021-01-28 DIAGNOSIS — I10 Essential (primary) hypertension: Secondary | ICD-10-CM

## 2021-01-28 MED ORDER — METOPROLOL SUCCINATE ER 50 MG PO TB24: 50.0000 mg | ORAL_TABLET | Freq: Every day | ORAL | 3 refills | Status: DC

## 2021-01-28 MED ORDER — GABAPENTIN 300 MG PO CAPS: 300.0000 mg | ORAL_CAPSULE | Freq: Three times a day (TID) | ORAL | 3 refills | Status: DC

## 2021-01-30 ENCOUNTER — Other Ambulatory Visit: Payer: Self-pay | Admitting: Family Medicine

## 2021-02-03 MED ORDER — ALPRAZOLAM 0.5 MG PO TABS: 0.5000 mg | ORAL_TABLET | Freq: Three times a day (TID) | ORAL | 1 refills | Status: DC | PRN

## 2021-02-11 ENCOUNTER — Other Ambulatory Visit: Payer: Self-pay

## 2021-02-11 ENCOUNTER — Other Ambulatory Visit: Payer: Self-pay | Admitting: Family Medicine

## 2021-02-11 DIAGNOSIS — I10 Essential (primary) hypertension: Secondary | ICD-10-CM

## 2021-02-11 MED ORDER — OMEPRAZOLE 20 MG PO CPDR: 20.0000 mg | DELAYED_RELEASE_CAPSULE | Freq: Every day | ORAL | 5 refills | Status: DC

## 2021-02-11 MED ORDER — LEVOCETIRIZINE DIHYDROCHLORIDE 5 MG PO TABS: 5.0000 mg | ORAL_TABLET | Freq: Every day | ORAL | 11 refills | Status: DC

## 2021-02-22 ENCOUNTER — Other Ambulatory Visit: Payer: Self-pay | Admitting: Family Medicine

## 2021-02-23 MED ORDER — VARENICLINE TARTRATE 1 MG PO TABS: 1.0000 mg | ORAL_TABLET | Freq: Two times a day (BID) | ORAL | 2 refills | Status: DC

## 2021-03-12 ENCOUNTER — Telehealth: Payer: Self-pay

## 2021-03-12 NOTE — Telephone Encounter (Signed)
Livalo 4MG  tablets approved through 03/13/2022. ?

## 2021-03-21 ENCOUNTER — Other Ambulatory Visit: Payer: Self-pay | Admitting: Family Medicine

## 2021-03-25 ENCOUNTER — Other Ambulatory Visit: Payer: Self-pay | Admitting: Family Medicine

## 2021-03-25 DIAGNOSIS — I10 Essential (primary) hypertension: Secondary | ICD-10-CM

## 2021-03-31 ENCOUNTER — Other Ambulatory Visit: Payer: Self-pay | Admitting: Family Medicine

## 2021-04-01 MED ORDER — OZEMPIC (0.25 OR 0.5 MG/DOSE) 2 MG/3ML ~~LOC~~ SOPN: 0.5000 mg | PEN_INJECTOR | SUBCUTANEOUS | 2 refills | Status: DC

## 2021-04-13 ENCOUNTER — Other Ambulatory Visit: Payer: Self-pay | Admitting: Family Medicine

## 2021-04-13 DIAGNOSIS — I10 Essential (primary) hypertension: Secondary | ICD-10-CM

## 2021-04-27 ENCOUNTER — Ambulatory Visit: Payer: Managed Care, Other (non HMO) | Admitting: Family Medicine

## 2021-04-30 ENCOUNTER — Ambulatory Visit (INDEPENDENT_AMBULATORY_CARE_PROVIDER_SITE_OTHER): Payer: Managed Care, Other (non HMO) | Admitting: Family Medicine

## 2021-04-30 ENCOUNTER — Encounter: Payer: Self-pay | Admitting: Family Medicine

## 2021-04-30 VITALS — BP 105/64 | HR 86 | Ht 68.0 in | Wt 191.0 lb

## 2021-04-30 DIAGNOSIS — E1140 Type 2 diabetes mellitus with diabetic neuropathy, unspecified: Secondary | ICD-10-CM | POA: Diagnosis not present

## 2021-04-30 DIAGNOSIS — E782 Mixed hyperlipidemia: Secondary | ICD-10-CM

## 2021-04-30 DIAGNOSIS — E1169 Type 2 diabetes mellitus with other specified complication: Secondary | ICD-10-CM | POA: Diagnosis not present

## 2021-04-30 DIAGNOSIS — I10 Essential (primary) hypertension: Secondary | ICD-10-CM | POA: Diagnosis not present

## 2021-04-30 DIAGNOSIS — M5416 Radiculopathy, lumbar region: Secondary | ICD-10-CM

## 2021-04-30 DIAGNOSIS — Z720 Tobacco use: Secondary | ICD-10-CM

## 2021-04-30 DIAGNOSIS — I2510 Atherosclerotic heart disease of native coronary artery without angina pectoris: Secondary | ICD-10-CM

## 2021-04-30 LAB — POCT GLYCOSYLATED HEMOGLOBIN (HGB A1C): HbA1c, POC (controlled diabetic range): 7.1 % — AB (ref 0.0–7.0)

## 2021-04-30 MED ORDER — PITAVASTATIN CALCIUM 4 MG PO TABS: 4.0000 mg | ORAL_TABLET | Freq: Every day | ORAL | 3 refills | Status: DC

## 2021-04-30 MED ORDER — TIRZEPATIDE 5 MG/0.5ML ~~LOC~~ SOAJ: 5.0000 mg | SUBCUTANEOUS | 0 refills | Status: DC

## 2021-04-30 MED ORDER — TIRZEPATIDE 2.5 MG/0.5ML ~~LOC~~ SOAJ: 2.5000 mg | SUBCUTANEOUS | 0 refills | Status: DC

## 2021-04-30 MED ORDER — TIRZEPATIDE 7.5 MG/0.5ML ~~LOC~~ SOAJ: 7.5000 mg | SUBCUTANEOUS | 0 refills | Status: DC

## 2021-04-30 NOTE — Assessment & Plan Note (Signed)
Counseled on smoking cessation.  Recommend continuation of Chantix. ?

## 2021-04-30 NOTE — Assessment & Plan Note (Signed)
Blood pressure is well controlled at this time.  Recommend continuation of current antihypertensive medications.  Smoking cessation advised. ?

## 2021-04-30 NOTE — Assessment & Plan Note (Signed)
Taking gabapentin as needed.  He does have hydrocodone that he uses occasionally as well. ?

## 2021-04-30 NOTE — Assessment & Plan Note (Signed)
He has done well and will follow in the past however previous insurance did not cover.  We will try sending this in again for him since he has new insurance coverage.  Additional options include Repatha. ?

## 2021-04-30 NOTE — Progress Notes (Signed)
?Patty Tham - 61 y.o. male MRN [id number]  Date of birth: [birth year] ? ?Subjective ?No chief complaint on file. ? ? ?HPI ?Vincent Wall is a 61 y.o. male here today for follow up visit.  Reports that he is doing "Ok" ? ?He has been using Ozempic for management of his blood sugars and was doing ok but reports that medication makes him too nauseous to continue.  He is open to trying something different. ? ?His blood pressure is managed with lisinopril/hctz and metoprolol.  Has history of CAD with CABG.  He has done well since last fall.  She denies anginal symptoms, dyspnea, headache or vision changes.  He has been intolerant to several statins.  Livalo worked well for him previously but previous insurance would not cover.  ? ?He does continue to smoke.  Was able to quit briefly with chantix.  He has been able to smoke a reduced amount with continuing chantix.   ? ?Chronic back pain managed with gabapentin as needed.  He does occasionally use hydrocodone on rare occasions for severe flare.  No refills needed at this time. ? ?ROS:  A comprehensive ROS was completed and negative except as noted per HPI ? ?Allergies  ?Allergen Reactions  ? Jardiance [Empagliflozin] Diarrhea  ? Citalopram Hydrobromide   ?  GI  ? Crestor [Rosuvastatin]   ?  Myalgia ? ?  ? Lipitor [Atorvastatin]   ?  Myalgias  ? Metformin And Related Diarrhea  ? Nabumetone   ?  GI  ? Viagra  [Sildenafil Citrate]   ?  GI upset  ? ? ?Past Medical History:  ?Diagnosis Date  ? Elevated uric acid in blood 06/16/2018  ? Heart attack ([place])   ? Heart disease   ? Hyperlipidemia   ? Hypertension   ? ? ?Past Surgical History:  ?Procedure Laterality Date  ? CORONARY STENT PLACEMENT  05/2010  ? ELBOW SURGERY  2010  ? ? ?Social History  ? ?Socioeconomic History  ? Marital status: Married  ?  Spouse name: Not on file  ? Number of children: Not on file  ? Years of education: Not on file  ? Highest education level: Not on file  ?Occupational History  ? Not on file  ?Tobacco Use   ? Smoking status: Every Day  ?  Packs/day: 1.00  ?  Years: 20.00  ?  Pack years: 20.00  ?  Types: Cigarettes  ? Smokeless tobacco: Never  ?Substance and Sexual Activity  ? Alcohol use: Not Currently  ?  Alcohol/week: 1.0 standard drink  ?  Types: 1 Standard drinks or equivalent per week  ? Drug use: No  ? Sexual activity: Yes  ?  Partners: Female  ?Other Topics Concern  ? Not on file  ?Social History Narrative  ? Not on file  ? ?Social Determinants of Health  ? ?Financial Resource Strain: Not on file  ?Food Insecurity: Not on file  ?Transportation Needs: Not on file  ?Physical Activity: Not on file  ?Stress: Not on file  ?Social Connections: Not on file  ? ? ?Family History  ?Problem Relation Age of Onset  ? Breast cancer Unknown   ? Heart attack Unknown   ? Diabetes Unknown   ? Hyperlipidemia Unknown   ? Hypertension Unknown   ? ? ?Health Maintenance  ?Topic Date Due  ? Zoster Vaccines- Shingrix (1 of 2) Never done  ? COVID-19 Vaccine (3 - Booster for Moderna series) 01/01/2020  ? OPHTHALMOLOGY EXAM  05/08/2020  ?  FOOT EXAM  04/24/2021  ? COLONOSCOPY (Pts 45-35yrs Insurance coverage will need to be confirmed)  01/05/2023 (Originally 01/06/2003)  ? HEMOGLOBIN A1C  07/26/2021  ? INFLUENZA VACCINE  08/04/2021  ? TETANUS/TDAP  04/25/2030  ? Hepatitis C Screening  Completed  ? HPV VACCINES  Aged Out  ? HIV Screening  Discontinued  ? ? ? ?----------------------------------------------------------------------------------------------------------------------------------------------------------------------------------------------------------------- ?Physical Exam ?BP 105/64 (BP Location: Left Arm, Patient Position: Sitting, Cuff Size: Normal)   Pulse 86   Ht 5\' 8"  (1.727 m)   Wt 191 lb (86.6 kg)   SpO2 97%   BMI 29.04 kg/m?  ? ?Physical Exam ?Constitutional:   ?   Appearance: Normal appearance.  ?Eyes:  ?   General: No scleral icterus. ?Cardiovascular:  ?   Rate and Rhythm: Normal rate and regular rhythm.  ?Pulmonary:  ?    Effort: Pulmonary effort is normal.  ?   Breath sounds: Normal breath sounds.  ?Musculoskeletal:  ?   Cervical back: Neck supple.  ?Neurological:  ?   Mental Status: He is alert.  ?Psychiatric:     ?   Mood and Affect: Mood normal.     ?   Behavior: Behavior normal.  ? ? ?------------------------------------------------------------------------------------------------------------------------------------------------------------------------------------------------------------------- ?Assessment and Plan ? ?Essential hypertension, benign ?Blood pressure is well controlled at this time.  Recommend continuation of current antihypertensive medications.  Smoking cessation advised. ? ?CAD (coronary artery disease) ?Continue management per cardiology.  No anginal symptoms at this time. ? ?Type 2 diabetes mellitus with diabetic neuropathy, unspecified ([place]) ?He had good response to Ozempic however was unable to continue this due to poor tolerance.  He is open to trying to [facility].  Prescription sent in.  Discussed that we can leave him at a lower dose for a prolonged period of time as he adjusted this to help with the nausea. ? ?Hyperlipidemia ?He has done well and will follow in the past however previous insurance did not cover.  We will try sending this in again for him since he has new insurance coverage.  Additional options include Repatha. ? ?Tobacco use ?Counseled on smoking cessation.  Recommend continuation of Chantix. ? ?Lumbar radiculopathy ?Taking gabapentin as needed.  He does have hydrocodone that he uses occasionally as well. ? ? ?No orders of the defined types were placed in this encounter. ? ? ?No follow-ups on file. ? ? ? ?This visit occurred during the SARS-CoV-2 public health emergency.  Safety protocols were in place, including screening questions prior to the visit, additional usage of staff PPE, and extensive cleaning of exam room while observing appropriate contact time as indicated for disinfecting  solutions.  ? ?

## 2021-04-30 NOTE — Assessment & Plan Note (Signed)
He had good response to Ozempic however was unable to continue this due to poor tolerance.  He is open to trying to [facility].  Prescription sent in.  Discussed that we can leave him at a lower dose for a prolonged period of time as he adjusted this to help with the nausea. ?

## 2021-04-30 NOTE — Assessment & Plan Note (Addendum)
Continue management per cardiology.  No anginal symptoms at this time. ?

## 2021-05-19 ENCOUNTER — Other Ambulatory Visit: Payer: Self-pay | Admitting: Family Medicine

## 2021-05-20 ENCOUNTER — Other Ambulatory Visit: Payer: Self-pay

## 2021-05-21 MED ORDER — TEMAZEPAM 15 MG PO CAPS
15.0000 mg | ORAL_CAPSULE | Freq: Every evening | ORAL | 5 refills | Status: DC | PRN
Start: 2021-05-21 — End: 2021-11-09

## 2021-07-15 ENCOUNTER — Other Ambulatory Visit: Payer: Self-pay | Admitting: Family Medicine

## 2021-07-15 DIAGNOSIS — I10 Essential (primary) hypertension: Secondary | ICD-10-CM

## 2021-07-18 ENCOUNTER — Other Ambulatory Visit: Payer: Self-pay | Admitting: Family Medicine

## 2021-08-04 ENCOUNTER — Encounter: Payer: Self-pay | Admitting: Family Medicine

## 2021-08-04 ENCOUNTER — Ambulatory Visit (INDEPENDENT_AMBULATORY_CARE_PROVIDER_SITE_OTHER): Payer: Managed Care, Other (non HMO) | Admitting: Family Medicine

## 2021-08-04 VITALS — BP 101/62 | HR 67 | Ht 68.0 in | Wt 196.1 lb

## 2021-08-04 DIAGNOSIS — F17210 Nicotine dependence, cigarettes, uncomplicated: Secondary | ICD-10-CM

## 2021-08-04 DIAGNOSIS — I10 Essential (primary) hypertension: Secondary | ICD-10-CM

## 2021-08-04 DIAGNOSIS — E790 Hyperuricemia without signs of inflammatory arthritis and tophaceous disease: Secondary | ICD-10-CM

## 2021-08-04 DIAGNOSIS — E782 Mixed hyperlipidemia: Secondary | ICD-10-CM

## 2021-08-04 DIAGNOSIS — E1140 Type 2 diabetes mellitus with diabetic neuropathy, unspecified: Secondary | ICD-10-CM | POA: Diagnosis not present

## 2021-08-04 DIAGNOSIS — E1169 Type 2 diabetes mellitus with other specified complication: Secondary | ICD-10-CM | POA: Diagnosis not present

## 2021-08-04 DIAGNOSIS — F17200 Nicotine dependence, unspecified, uncomplicated: Secondary | ICD-10-CM | POA: Insufficient documentation

## 2021-08-04 LAB — POCT GLYCOSYLATED HEMOGLOBIN (HGB A1C): HbA1c, POC (controlled diabetic range): 13 % — AB (ref 0.0–7.0)

## 2021-08-04 MED ORDER — BD PEN NEEDLE NANO 2ND GEN 32G X 4 MM MISC: 3 refills | Status: DC

## 2021-08-04 MED ORDER — COLCHICINE 0.6 MG PO TABS: ORAL_TABLET | ORAL | 0 refills | Status: DC

## 2021-08-04 MED ORDER — TOUJEO SOLOSTAR 300 UNIT/ML ~~LOC~~ SOPN: 20.0000 [IU] | PEN_INJECTOR | Freq: Every day | SUBCUTANEOUS | 0 refills | Status: DC

## 2021-08-04 MED ORDER — TOUJEO MAX SOLOSTAR 300 UNIT/ML ~~LOC~~ SOPN: 20.0000 [IU] | PEN_INJECTOR | Freq: Every day | SUBCUTANEOUS | 1 refills | Status: DC

## 2021-08-04 NOTE — Assessment & Plan Note (Signed)
Blood pressures are well controlled at this time.  Recommend continuation of current medications

## 2021-08-04 NOTE — Assessment & Plan Note (Signed)
He has been intolerant to several statins.  Able to tolerate Livalo however insurance would not cover.  Injectable medications discussed with him however he is not interested in these.  Discussed he is at high risk for additional cardiac events with elevated cholesterol.

## 2021-08-04 NOTE — Assessment & Plan Note (Signed)
He is having pain at the MTP joint.  No significant swelling however suspect gout may be contributing.  Adding colchicine.

## 2021-08-04 NOTE — Assessment & Plan Note (Signed)
Diabetes is not well controlled.  A1c of 13% today.  He has been intolerant to all GLP-1 medications.  We discussed that in addition to dietary changes I would recommend insulin especially since he is symptomatic with increased thirst and urination.  Starting Toujeo max 20 mg daily with plan to titrate as needed.  Encouraged to work on dietary changes as he is consuming a lot of sweet tea and lemonade..  I do have a sample of regular Toujeo which I provided to him today.

## 2021-08-04 NOTE — Assessment & Plan Note (Signed)
Counseled on smoking cessation.  Recommend he completely quit due to his history of coronary artery disease and COPD.

## 2021-08-04 NOTE — Progress Notes (Signed)
Vincent Wall - 61 y.o. male MRN [id number]  Date of birth: [birth year]  Subjective Chief Complaint  Patient presents with   Diabetes    HPI Vincent Wall is a 61 y.o. male here today for follow up visit.    Mounjaro added at previous visit as he was having quite a bit of nausea with Ozempic.  Stopped this after 1 dose due to nausea and vomiting.  He is not checking blood sugars.  He has had increased thirst and urination.    He has seen cardiology recently as well.  Eliquis added for history of a. Fib/flutter, he has not started this yet.  Continues on effient.  Plan to continue this until he is out of Effient and then will switch to eliquis.  He is on metoprolol for rate control.  BP remains well controlled with this as well as lisinopril/hctz.  He has not had new anginal symptoms.    Continues to smoke.  Tried chantix and was able to cut back but not quit.     He has had some pain in his great toe.  History of elevated uric acid.  He has not noted any swelling.  Describes pain as sharp with movement at the MTP joint. Allergies  Allergen Reactions   Jardiance [Empagliflozin] Diarrhea   Citalopram Hydrobromide     GI   Crestor [Rosuvastatin]     Myalgia     Lipitor [Atorvastatin]     Myalgias   Metformin And Related Diarrhea   Nabumetone     GI   Viagra  [Sildenafil Citrate]     GI upset    Past Medical History:  Diagnosis Date   Elevated uric acid in blood 06/16/2018   Heart attack (HCC)    Heart disease    Hyperlipidemia    Hypertension     Past Surgical History:  Procedure Laterality Date   CORONARY STENT PLACEMENT  05/2010   ELBOW SURGERY  2010    Social History   Socioeconomic History   Marital status: Married    Spouse name: Not on file   Number of children: Not on file   Years of education: Not on file   Highest education level: Not on file  Occupational History   Not on file  Tobacco Use   Smoking status: Every Day    Packs/day: 1.00    Years: 20.00     Total pack years: 20.00    Types: Cigarettes   Smokeless tobacco: Never  Substance and Sexual Activity   Alcohol use: Not Currently    Alcohol/week: 1.0 standard drink of alcohol    Types: 1 Standard drinks or equivalent per week   Drug use: No   Sexual activity: Yes    Partners: Female  Other Topics Concern   Not on file  Social History Narrative   Not on file   Social Determinants of Health   Financial Resource Strain: Not on file  Food Insecurity: Not on file  Transportation Needs: Not on file  Physical Activity: Not on file  Stress: Not on file  Social Connections: Not on file    Family History  Problem Relation Age of Onset   Breast cancer Unknown    Heart attack Unknown    Diabetes Unknown    Hyperlipidemia Unknown    Hypertension Unknown     Health Maintenance  Topic Date Due   INFLUENZA VACCINE  08/04/2021   Diabetic kidney evaluation - Urine ACR  11/04/2021 (Originally 01/06/1976)  OPHTHALMOLOGY EXAM  11/04/2021 (Originally 05/08/2020)   COVID-19 Vaccine (3 - Moderna series) 11/04/2021 (Originally 01/01/2020)   Zoster Vaccines- Shingrix (1 of 2) 11/04/2021 (Originally 01/06/2008)   COLONOSCOPY (Pts 45-62yrs Insurance coverage will need to be confirmed)  01/05/2023 (Originally 01/06/2003)   Diabetic kidney evaluation - GFR measurement  11/04/2021   HEMOGLOBIN A1C  02/04/2022   FOOT EXAM  08/05/2022   TETANUS/TDAP  04/25/2030   Hepatitis C Screening  Completed   HPV VACCINES  Aged Out   HIV Screening  Discontinued     ----------------------------------------------------------------------------------------------------------------------------------------------------------------------------------------------------------------- Physical Exam BP 101/62 (BP Location: Left Arm, Patient Position: Sitting, Cuff Size: Normal)   Pulse 67   Ht 5\' 8"  (1.727 m)   Wt 196 lb 1.9 oz (89 kg)   SpO2 99%   BMI 29.82 kg/m   Physical Exam Constitutional:      Appearance:  Normal appearance.  HENT:     Head: Normocephalic and atraumatic.  Eyes:     General: No scleral icterus. Cardiovascular:     Rate and Rhythm: Normal rate and regular rhythm.  Pulmonary:     Effort: Pulmonary effort is normal.     Breath sounds: Normal breath sounds.  Neurological:     General: No focal deficit present.     Mental Status: He is alert.  Psychiatric:        Mood and Affect: Mood normal.        Behavior: Behavior normal.     ------------------------------------------------------------------------------------------------------------------------------------------------------------------------------------------------------------------- Assessment and Plan  Essential hypertension, benign Blood pressures are well controlled at this time.  Recommend continuation of current medications  Type 2 diabetes mellitus with diabetic neuropathy, unspecified (HCC) Diabetes is not well controlled.  A1c of 13% today.  He has been intolerant to all GLP-1 medications.  We discussed that in addition to dietary changes I would recommend insulin especially since he is symptomatic with increased thirst and urination.  Starting Toujeo max 20 mg daily with plan to titrate as needed.  Encouraged to work on dietary changes as he is consuming a lot of sweet tea and lemonade..  I do have a sample of regular Toujeo which I provided to him today.  Elevated uric acid in blood He is having pain at the MTP joint.  No significant swelling however suspect gout may be contributing.  Adding colchicine.  Nicotine dependence Counseled on smoking cessation.  Recommend he completely quit due to his history of coronary artery disease and COPD.  Hyperlipidemia He has been intolerant to several statins.  Able to tolerate Livalo however insurance would not cover.  Injectable medications discussed with him however he is not interested in these.  Discussed he is at high risk for additional cardiac events with  elevated cholesterol.   Meds ordered this encounter  Medications   insulin glargine, 2 Unit Dial, (TOUJEO MAX SOLOSTAR) 300 UNIT/ML Solostar Pen    Sig: Inject 20 Units into the skin daily.    Dispense:  6 mL    Refill:  1   Insulin Pen Needle (BD PEN NEEDLE NANO 2ND GEN) 32G X 4 MM MISC    Sig: Use to inject insulin daily.    Dispense:  90 each    Refill:  3   colchicine 0.6 MG tablet    Sig: Start 1.2mg  followed by 0.6mg  1 hour later.  Continue 0.6mg  daily as needed for toe pain    Dispense:  30 tablet    Refill:  0   insulin glargine, 1 Unit  Dial, (TOUJEO SOLOSTAR) 300 UNIT/ML Solostar Pen    Sig: Inject 20 Units into the skin daily. Lot: [id number] Exp: 09/04/22    Dispense:  1.5 mL    Refill:  0    Return in about 3 months (around 11/04/2021) for T2DM.    This visit occurred during the SARS-CoV-2 public health emergency.  Safety protocols were in place, including screening questions prior to the visit, additional usage of staff PPE, and extensive cleaning of exam room while observing appropriate contact time as indicated for disinfecting solutions.

## 2021-08-04 NOTE — Patient Instructions (Addendum)
Start toujeo max  20 units daily.   Check blood sugar regularly.  Cut back on lemonade and sweet tea. Follow up in 3 months.

## 2021-08-07 ENCOUNTER — Telehealth: Payer: Self-pay

## 2021-08-07 NOTE — Telephone Encounter (Signed)
Pt lvm stating he had not received medications: hydrocodone and cream (for bumps).   Unable to verify these medications were intended for ordering by Dr. Ashley Royalty. Not mentioned in office visit notes.   Sent to covering provider for advise or consideration.

## 2021-08-11 ENCOUNTER — Other Ambulatory Visit: Payer: Self-pay | Admitting: Family Medicine

## 2021-08-11 MED ORDER — HYDROCODONE-ACETAMINOPHEN 10-325 MG PO TABS: 1.0000 | ORAL_TABLET | Freq: Three times a day (TID) | ORAL | 0 refills | Status: DC | PRN

## 2021-08-31 ENCOUNTER — Other Ambulatory Visit: Payer: Self-pay | Admitting: Family Medicine

## 2021-09-02 LAB — HM DIABETES EYE EXAM

## 2021-09-22 ENCOUNTER — Other Ambulatory Visit: Payer: Self-pay | Admitting: Family Medicine

## 2021-10-02 ENCOUNTER — Encounter: Payer: Self-pay | Admitting: Family Medicine

## 2021-10-23 ENCOUNTER — Other Ambulatory Visit: Payer: Self-pay | Admitting: Family Medicine

## 2021-11-09 ENCOUNTER — Telehealth: Payer: Self-pay | Admitting: Family Medicine

## 2021-11-09 ENCOUNTER — Ambulatory Visit: Payer: Managed Care, Other (non HMO) | Admitting: Family Medicine

## 2021-11-09 ENCOUNTER — Other Ambulatory Visit: Payer: Self-pay

## 2021-11-09 DIAGNOSIS — G629 Polyneuropathy, unspecified: Secondary | ICD-10-CM

## 2021-11-09 DIAGNOSIS — E790 Hyperuricemia without signs of inflammatory arthritis and tophaceous disease: Secondary | ICD-10-CM

## 2021-11-09 DIAGNOSIS — I10 Essential (primary) hypertension: Secondary | ICD-10-CM

## 2021-11-09 DIAGNOSIS — M5416 Radiculopathy, lumbar region: Secondary | ICD-10-CM

## 2021-11-09 DIAGNOSIS — E1140 Type 2 diabetes mellitus with diabetic neuropathy, unspecified: Secondary | ICD-10-CM

## 2021-11-09 MED ORDER — COLCHICINE 0.6 MG PO TABS: ORAL_TABLET | ORAL | 1 refills | Status: DC

## 2021-11-09 MED ORDER — TOUJEO MAX SOLOSTAR 300 UNIT/ML ~~LOC~~ SOPN: 20.0000 [IU] | PEN_INJECTOR | Freq: Every day | SUBCUTANEOUS | 1 refills | Status: DC

## 2021-11-09 MED ORDER — LISINOPRIL-HYDROCHLOROTHIAZIDE 20-12.5 MG PO TABS: 1.0000 | ORAL_TABLET | Freq: Every day | ORAL | 1 refills | Status: DC

## 2021-11-09 MED ORDER — ALLOPURINOL 100 MG PO TABS: 100.0000 mg | ORAL_TABLET | Freq: Every day | ORAL | 1 refills | Status: DC

## 2021-11-09 MED ORDER — PRASUGREL HCL 10 MG PO TABS: 10.0000 mg | ORAL_TABLET | Freq: Every day | ORAL | 1 refills | Status: DC

## 2021-11-09 MED ORDER — TEMAZEPAM 15 MG PO CAPS: 15.0000 mg | ORAL_CAPSULE | Freq: Every evening | ORAL | 5 refills | Status: DC | PRN

## 2021-11-09 MED ORDER — GABAPENTIN 300 MG PO CAPS: 300.0000 mg | ORAL_CAPSULE | Freq: Three times a day (TID) | ORAL | 1 refills | Status: DC

## 2021-11-09 NOTE — Telephone Encounter (Signed)
Patient, during rescheduling, stated he needs a temporary refill (scheduled for 11/22) on medications for heart, blood pressure, diabetes, pain, and something for gout (insurance denies the one he is currently on per patient), indigestion, temazopam, diabetes needles, and hydrocodone. Vincent Wall

## 2021-11-10 MED ORDER — TEMAZEPAM 15 MG PO CAPS: 15.0000 mg | ORAL_CAPSULE | Freq: Every evening | ORAL | 5 refills | Status: DC | PRN

## 2021-11-10 MED ORDER — HYDROCODONE-ACETAMINOPHEN 10-325 MG PO TABS: 1.0000 | ORAL_TABLET | Freq: Three times a day (TID) | ORAL | 0 refills | Status: DC | PRN

## 2021-11-17 NOTE — Telephone Encounter (Signed)
Completed on 10/11/21

## 2021-11-25 ENCOUNTER — Encounter: Payer: Self-pay | Admitting: Family Medicine

## 2021-11-25 ENCOUNTER — Ambulatory Visit (INDEPENDENT_AMBULATORY_CARE_PROVIDER_SITE_OTHER): Payer: Managed Care, Other (non HMO) | Admitting: Family Medicine

## 2021-11-25 VITALS — BP 92/56 | HR 65 | Ht 68.0 in | Wt 199.0 lb

## 2021-11-25 DIAGNOSIS — I4719 Other supraventricular tachycardia: Secondary | ICD-10-CM | POA: Insufficient documentation

## 2021-11-25 DIAGNOSIS — F5101 Primary insomnia: Secondary | ICD-10-CM

## 2021-11-25 DIAGNOSIS — Z720 Tobacco use: Secondary | ICD-10-CM | POA: Diagnosis not present

## 2021-11-25 DIAGNOSIS — E1140 Type 2 diabetes mellitus with diabetic neuropathy, unspecified: Secondary | ICD-10-CM | POA: Diagnosis not present

## 2021-11-25 DIAGNOSIS — I10 Essential (primary) hypertension: Secondary | ICD-10-CM

## 2021-11-25 LAB — POCT GLYCOSYLATED HEMOGLOBIN (HGB A1C): HbA1c, POC (controlled diabetic range): 12.5 % — AB (ref 0.0–7.0)

## 2021-11-25 MED ORDER — VARENICLINE TARTRATE 1 MG PO TABS: 1.0000 mg | ORAL_TABLET | Freq: Two times a day (BID) | ORAL | 2 refills | Status: DC

## 2021-11-25 MED ORDER — TOUJEO MAX SOLOSTAR 300 UNIT/ML ~~LOC~~ SOPN: 40.0000 [IU] | PEN_INJECTOR | Freq: Every day | SUBCUTANEOUS | 1 refills | Status: DC

## 2021-11-25 MED ORDER — TEMAZEPAM 15 MG PO CAPS: 15.0000 mg | ORAL_CAPSULE | Freq: Every evening | ORAL | 5 refills | Status: DC | PRN

## 2021-11-25 NOTE — Assessment & Plan Note (Signed)
Temazepam renewed for now.

## 2021-11-25 NOTE — Assessment & Plan Note (Signed)
He is now on eliquis for CVA prevention.  Remains on metoprolol for rate control.  Denies symptoms.

## 2021-11-25 NOTE — Progress Notes (Signed)
Vincent Wall - 61 y.o. male MRN [id number]  Date of birth: 08/04/1960  Subjective Chief Complaint  Patient presents with   Diabetes    HPI Vincent Wall is a 61 y.o. male here today for follow up visit.   Reports that he is feeling pretty good.    Toujeo added previously.  Reports that he is using this regularly.  He is not checking blood sugars regularly but does feel thirsty often. He denies symptoms of hypoglycemia.    Continues on lisinopril/hctz and metoprolol for management of HTN.  Eliquis added previously as his EKG at cardiology did show atrial tachycardia. Effient and asa discontinued.  Continues to see cardiology regularly.  He has not had any new chest pain, shortness of breath, palpitations, headache or vision changes.    On chronic norco as needed for lower back pain.  He does use this pretty sparingly.  Denies side effects from medication.    ROS:  A comprehensive ROS was completed and negative except as noted per HPI  Allergies  Allergen Reactions   Jardiance [Empagliflozin] Diarrhea   Citalopram Hydrobromide     GI   Crestor [Rosuvastatin]     Myalgia     Lipitor [Atorvastatin]     Myalgias   Metformin And Related Diarrhea   Nabumetone     GI   Viagra  [Sildenafil Citrate]     GI upset    Past Medical History:  Diagnosis Date   Elevated uric acid in blood 06/16/2018   Heart attack (HCC)    Heart disease    Hyperlipidemia    Hypertension     Past Surgical History:  Procedure Laterality Date   CORONARY STENT PLACEMENT  05/2010   ELBOW SURGERY  2010    Social History   Socioeconomic History   Marital status: Married    Spouse name: Not on file   Number of children: Not on file   Years of education: Not on file   Highest education level: Not on file  Occupational History   Not on file  Tobacco Use   Smoking status: Every Day    Packs/day: 1.00    Years: 20.00    Total pack years: 20.00    Types: Cigarettes   Smokeless tobacco: Never   Substance and Sexual Activity   Alcohol use: Not Currently    Alcohol/week: 1.0 standard drink of alcohol    Types: 1 Standard drinks or equivalent per week   Drug use: No   Sexual activity: Yes    Partners: Female  Other Topics Concern   Not on file  Social History Narrative   Not on file   Social Determinants of Health   Financial Resource Strain: Not on file  Food Insecurity: Not on file  Transportation Needs: Not on file  Physical Activity: Not on file  Stress: Not on file  Social Connections: Not on file    Family History  Problem Relation Age of Onset   Breast cancer Unknown    Heart attack Unknown    Diabetes Unknown    Hyperlipidemia Unknown    Hypertension Unknown     Health Maintenance  Topic Date Due   Diabetic kidney evaluation - Urine ACR  Never done   Diabetic kidney evaluation - GFR measurement  11/04/2021   COVID-19 Vaccine (3 - 2023-24 season) 02/04/2022 (Originally 09/04/2021)   Zoster Vaccines- Shingrix (1 of 2) 02/25/2022 (Originally 01/06/2008)   INFLUENZA VACCINE  04/04/2022 (Originally 08/04/2021)   Lung Cancer  Screening  11/26/2022 (Originally 01/06/2008)   COLONOSCOPY (Pts 45-71yrs Insurance coverage will need to be confirmed)  01/05/2023 (Originally 01/06/2003)   HEMOGLOBIN A1C  02/04/2022   FOOT EXAM  08/05/2022   OPHTHALMOLOGY EXAM  09/03/2022   Hepatitis C Screening  Completed   HPV VACCINES  Aged Out   HIV Screening  Discontinued     ----------------------------------------------------------------------------------------------------------------------------------------------------------------------------------------------------------------- Physical Exam BP (!) 92/56 (BP Location: Left Arm, Patient Position: Sitting, Cuff Size: Normal)   Pulse 65   Ht 5\' 8"  (1.727 m)   Wt 199 lb (90.3 kg)   SpO2 94%   BMI 30.26 kg/m   Physical Exam Constitutional:      Appearance: Normal appearance.  HENT:     Head: Normocephalic and atraumatic.   Eyes:     General: No scleral icterus. Cardiovascular:     Rate and Rhythm: Normal rate and regular rhythm.  Pulmonary:     Effort: Pulmonary effort is normal.     Breath sounds: Normal breath sounds.  Musculoskeletal:     Cervical back: Neck supple.  Neurological:     General: No focal deficit present.     Mental Status: He is alert.  Psychiatric:        Mood and Affect: Mood normal.        Behavior: Behavior normal.     ------------------------------------------------------------------------------------------------------------------------------------------------------------------------------------------------------------------- Assessment and Plan  Essential hypertension, benign BP is a little low today, denies symptom of hypotension.  More relaxed as he is off work this week. Will plan to continue current medications.  He will let me know if he develops new symptoms.    Type 2 diabetes mellitus with diabetic neuropathy, unspecified (HCC) Diabetes remains uncontrolled.  Continue titration of toujeo to 40 units daily. Discussed that he really needs to work on diet, he remains resistant to this. Follow up in 3 months.   Tobacco use On chantix, has cut back to 3 cigarettes per day.  Encouraged him to quit completely.   Atrial tachycardia He is now on eliquis for CVA prevention.  Remains on metoprolol for rate control.  Denies symptoms.   Insomnia Temazepam renewed for now.    Meds ordered this encounter  Medications   insulin glargine, 2 Unit Dial, (TOUJEO MAX SOLOSTAR) 300 UNIT/ML Solostar Pen    Sig: Inject 40 Units into the skin daily.    Dispense:  12 mL    Refill:  1   varenicline (CHANTIX) 1 MG tablet    Sig: Take 1 tablet (1 mg total) by mouth 2 (two) times daily.    Dispense:  60 tablet    Refill:  2   temazepam (RESTORIL) 15 MG capsule    Sig: Take 1-2 capsules (15-30 mg total) by mouth at bedtime as needed for sleep.    Dispense:  60 capsule    Refill:  5     Call pt when ready    Return in about 3 months (around 02/25/2022) for T2DM/Fasting labs.    This visit occurred during the SARS-CoV-2 public health emergency.  Safety protocols were in place, including screening questions prior to the visit, additional usage of staff PPE, and extensive cleaning of exam room while observing appropriate contact time as indicated for disinfecting solutions.

## 2021-11-25 NOTE — Assessment & Plan Note (Signed)
On chantix, has cut back to 3 cigarettes per day.  Encouraged him to quit completely.

## 2021-11-25 NOTE — Assessment & Plan Note (Signed)
BP is a little low today, denies symptom of hypotension.  More relaxed as he is off work this week. Will plan to continue current medications.  He will let me know if he develops new symptoms.

## 2021-11-25 NOTE — Assessment & Plan Note (Signed)
Diabetes remains uncontrolled.  Continue titration of toujeo to 40 units daily. Discussed that he really needs to work on diet, he remains resistant to this. Follow up in 3 months.

## 2021-11-25 NOTE — Patient Instructions (Signed)
Increase toujeo to 40 units daily.  Reduce carbohydrate intake.  See me again in 3-4 months.

## 2021-12-02 ENCOUNTER — Telehealth: Payer: Self-pay

## 2021-12-02 NOTE — Telephone Encounter (Addendum)
Initiated Prior authorization [id number] Tartrate 1MG  tablets Via: Covermymeds Case/Key:[id number] Status: approved  as of 12/02/21 Reason:approved through 12/03/2022. Notified Pt via:Pt does not have  Mychart called and left detailed vm

## 2021-12-08 ENCOUNTER — Other Ambulatory Visit: Payer: Self-pay | Admitting: Family Medicine

## 2022-01-25 ENCOUNTER — Other Ambulatory Visit: Payer: Self-pay | Admitting: Family Medicine

## 2022-02-14 ENCOUNTER — Other Ambulatory Visit: Payer: Self-pay | Admitting: Family Medicine

## 2022-02-25 ENCOUNTER — Ambulatory Visit: Payer: Managed Care, Other (non HMO) | Admitting: Family Medicine

## 2022-03-09 ENCOUNTER — Encounter: Payer: Self-pay | Admitting: Family Medicine

## 2022-03-09 ENCOUNTER — Ambulatory Visit (INDEPENDENT_AMBULATORY_CARE_PROVIDER_SITE_OTHER): Payer: BC Managed Care – PPO | Admitting: Family Medicine

## 2022-03-09 VITALS — BP 108/57 | HR 59 | Ht 68.0 in | Wt 196.0 lb

## 2022-03-09 DIAGNOSIS — E1140 Type 2 diabetes mellitus with diabetic neuropathy, unspecified: Secondary | ICD-10-CM

## 2022-03-09 DIAGNOSIS — G629 Polyneuropathy, unspecified: Secondary | ICD-10-CM | POA: Diagnosis not present

## 2022-03-09 DIAGNOSIS — E790 Hyperuricemia without signs of inflammatory arthritis and tophaceous disease: Secondary | ICD-10-CM | POA: Diagnosis not present

## 2022-03-09 DIAGNOSIS — F17210 Nicotine dependence, cigarettes, uncomplicated: Secondary | ICD-10-CM

## 2022-03-09 DIAGNOSIS — I4719 Other supraventricular tachycardia: Secondary | ICD-10-CM | POA: Diagnosis not present

## 2022-03-09 DIAGNOSIS — M5416 Radiculopathy, lumbar region: Secondary | ICD-10-CM | POA: Diagnosis not present

## 2022-03-09 DIAGNOSIS — I10 Essential (primary) hypertension: Secondary | ICD-10-CM

## 2022-03-09 LAB — POCT GLYCOSYLATED HEMOGLOBIN (HGB A1C): HbA1c, POC (controlled diabetic range): 7.9 % — AB (ref 0.0–7.0)

## 2022-03-09 LAB — POCT UA - MICROALBUMIN
Creatinine, POC: 200 mg/dL
Microalbumin Ur, POC: 80 mg/L

## 2022-03-09 MED ORDER — APIXABAN 5 MG PO TABS: 5.0000 mg | ORAL_TABLET | Freq: Two times a day (BID) | ORAL | 0 refills | Status: DC

## 2022-03-09 MED ORDER — COLCHICINE 0.6 MG PO TABS: ORAL_TABLET | ORAL | 1 refills | Status: DC

## 2022-03-09 MED ORDER — LISINOPRIL-HYDROCHLOROTHIAZIDE 20-12.5 MG PO TABS: 1.0000 | ORAL_TABLET | Freq: Every day | ORAL | 1 refills | Status: DC

## 2022-03-09 MED ORDER — TOUJEO MAX SOLOSTAR 300 UNIT/ML ~~LOC~~ SOPN: 40.0000 [IU] | PEN_INJECTOR | Freq: Every day | SUBCUTANEOUS | 3 refills | Status: DC

## 2022-03-09 MED ORDER — OMEPRAZOLE 20 MG PO CPDR: 20.0000 mg | DELAYED_RELEASE_CAPSULE | Freq: Every day | ORAL | 1 refills | Status: DC

## 2022-03-09 MED ORDER — ALLOPURINOL 100 MG PO TABS: 100.0000 mg | ORAL_TABLET | Freq: Every day | ORAL | 1 refills | Status: DC

## 2022-03-09 MED ORDER — TEMAZEPAM 30 MG PO CAPS: 30.0000 mg | ORAL_CAPSULE | Freq: Every evening | ORAL | 1 refills | Status: DC | PRN

## 2022-03-09 MED ORDER — GABAPENTIN 300 MG PO CAPS: 300.0000 mg | ORAL_CAPSULE | Freq: Three times a day (TID) | ORAL | 1 refills | Status: DC

## 2022-03-09 MED ORDER — METOPROLOL SUCCINATE ER 50 MG PO TB24: 50.0000 mg | ORAL_TABLET | Freq: Every day | ORAL | 1 refills | Status: DC

## 2022-03-09 NOTE — Assessment & Plan Note (Signed)
Blood pressure is well-controlled at this time.  Recommend continuation of current medications for management of hypertension.

## 2022-03-09 NOTE — Assessment & Plan Note (Signed)
Remains on eliquis for anticoagulation.  Rate controlled with metoprolol.  He will continue on current medications.  Provided with a few weeks of eliquis samples.

## 2022-03-09 NOTE — Progress Notes (Signed)
Vincent Wall - 62 y.o. male MRN [id number]  Date of birth: 11-28-1960  Subjective Chief Complaint  Patient presents with   Diabetes    HPI Vincent Wall is a 62 year old male here today for follow-up visit.  He reports he is doing pretty well.  Effient was discontinued at his last cardiology appointment.  He was started on eliquis for anticoagulation due to paroxysmal atrial fibrillation.  He denies any side effects from this however it is a little bit pricey.  Remains on metoprolol for rate control and blood pressure control.  Additionally he is on lisinopril and hydrochlorothiazide for blood pressure control.  He denies chest pain, shortness of breath, palpitations, headaches or vision changes.  Diabetes is currently managed with Toujeo at 40 units daily.  A1c has improved to 7.9% today.  Has been working on some dietary changes.  Continues on Restoril 30 mg nightly as needed for sleep.  This is working well for him.  No symptoms of oversedation.  ROS:  A comprehensive ROS was completed and negative except as noted per HPI  Allergies  Allergen Reactions   Jardiance [Empagliflozin] Diarrhea   Citalopram Hydrobromide     GI   Crestor [Rosuvastatin]     Myalgia     Lipitor [Atorvastatin]     Myalgias   Metformin And Related Diarrhea   Nabumetone     GI   Viagra  [Sildenafil Citrate]     GI upset    Past Medical History:  Diagnosis Date   Elevated uric acid in blood 06/16/2018   Heart attack ([place])    Heart disease    Hyperlipidemia    Hypertension     Past Surgical History:  Procedure Laterality Date   CORONARY STENT PLACEMENT  05/2010   ELBOW SURGERY  2010    Social History   Socioeconomic History   Marital status: Married    Spouse name: Not on file   Number of children: Not on file   Years of education: Not on file   Highest education level: Not on file  Occupational History   Not on file  Tobacco Use   Smoking status: Every Day    Packs/day: 1.00    Years:  20.00    Total pack years: 20.00    Types: Cigarettes   Smokeless tobacco: Never  Substance and Sexual Activity   Alcohol use: Not Currently    Alcohol/week: 1.0 standard drink of alcohol    Types: 1 Standard drinks or equivalent per week   Drug use: No   Sexual activity: Yes    Partners: Female  Other Topics Concern   Not on file  Social History Narrative   Not on file   Social Determinants of Health   Financial Resource Strain: Not on file  Food Insecurity: Not on file  Transportation Needs: Not on file  Physical Activity: Not on file  Stress: Not on file  Social Connections: Not on file    Family History  Problem Relation Age of Onset   Breast cancer Unknown    Heart attack Unknown    Diabetes Unknown    Hyperlipidemia Unknown    Hypertension Unknown     Health Maintenance  Topic Date Due   Diabetic kidney evaluation - eGFR measurement  11/04/2021   INFLUENZA VACCINE  04/04/2022 (Originally 08/04/2021)   Lung Cancer Screening  11/26/2022 (Originally 01/06/2008)   COLONOSCOPY (Pts 45-8yr Insurance coverage will need to be confirmed)  01/05/2023 (Originally 01/06/2003)   COVID-19 Vaccine (3 -  2023-24 season) 03/25/2023 (Originally 09/04/2021)   Zoster Vaccines- Shingrix (1 of 2) 06/09/2023 (Originally 01/06/2008)   FOOT EXAM  08/05/2022   OPHTHALMOLOGY EXAM  09/03/2022   HEMOGLOBIN A1C  09/09/2022   Diabetic kidney evaluation - Urine ACR  03/09/2023   DTaP/Tdap/Td (3 - Td or Tdap) 04/25/2030   Hepatitis C Screening  Completed   HPV VACCINES  Aged Out   HIV Screening  Discontinued     ----------------------------------------------------------------------------------------------------------------------------------------------------------------------------------------------------------------- Physical Exam BP (!) 108/57 (BP Location: Right Arm, Patient Position: Sitting, Cuff Size: Large)   Pulse (!) 59   Ht '5\' 8"'$  (1.727 m)   Wt 196 lb (88.9 kg)   SpO2 98%   BMI  29.80 kg/m   Physical Exam Constitutional:      Appearance: Normal appearance.  HENT:     Head: Normocephalic and atraumatic.  Eyes:     General: No scleral icterus. Cardiovascular:     Rate and Rhythm: Normal rate and regular rhythm.  Pulmonary:     Effort: Pulmonary effort is normal.     Breath sounds: Normal breath sounds.  Musculoskeletal:     Cervical back: Neck supple.  Neurological:     Mental Status: He is alert.  Psychiatric:        Mood and Affect: Mood normal.        Behavior: Behavior normal.     ------------------------------------------------------------------------------------------------------------------------------------------------------------------------------------------------------------------- Assessment and Plan  Atrial tachycardia Remains on eliquis for anticoagulation.  Rate controlled with metoprolol.  He will continue on current medications.  Provided with a few weeks of eliquis samples.  Essential hypertension, benign Blood pressure is well-controlled at this time.  Recommend continuation of current medications for management of hypertension.  Type 2 diabetes mellitus with diabetic neuropathy, unspecified ([place]) Blood sugars are better controlled.  Continue Toujeo at current strength.  Encouraged continued work on dietary changes and incorporation of regular activity.  Elevated uric acid in blood Continue allopurinol at current strength.  Colchicine as needed for breakthrough  Nicotine dependence He continues to have difficulty with quitting smoking.  He has been able to cut back but never completely quit.  Has tried multiple medications to help with this without success.  Counseled on continue to work on this.   Meds ordered this encounter  Medications   allopurinol (ZYLOPRIM) 100 MG tablet    Sig: Take 1 tablet (100 mg total) by mouth daily.    Dispense:  90 tablet    Refill:  1   colchicine 0.6 MG tablet    Sig: START 2 TABS FOLLOWED  BY 1 TAB 1 HOUR LATER. CONTINUE 1 TAB DAILY AS NEEDED FOR TOE PAIN    Dispense:  90 tablet    Refill:  1   gabapentin (NEURONTIN) 300 MG capsule    Sig: Take 1 capsule (300 mg total) by mouth 3 (three) times daily. As needed.    Dispense:  270 capsule    Refill:  1   insulin glargine, 2 Unit Dial, (TOUJEO MAX SOLOSTAR) 300 UNIT/ML Solostar Pen    Sig: Inject 40 Units into the skin daily.    Dispense:  12 mL    Refill:  3   lisinopril-hydrochlorothiazide (ZESTORETIC) 20-12.5 MG tablet    Sig: Take 1 tablet by mouth daily.    Dispense:  90 tablet    Refill:  1   metoprolol succinate (TOPROL-XL) 50 MG 24 hr tablet    Sig: Take 1 tablet (50 mg total) by mouth daily with breakfast. Take with  or immediately following a meal.    Dispense:  90 tablet    Refill:  1   omeprazole (PRILOSEC) 20 MG capsule    Sig: Take 1 capsule (20 mg total) by mouth daily.    Dispense:  90 capsule    Refill:  1   apixaban (ELIQUIS) 5 MG TABS tablet    Sig: Take 1 tablet (5 mg total) by mouth 2 (two) times daily. Lot: [id number] Exp: 05/2023    Dispense:  42 tablet    Refill:  0   temazepam (RESTORIL) 30 MG capsule    Sig: Take 1 capsule (30 mg total) by mouth at bedtime as needed for sleep.    Dispense:  90 capsule    Refill:  1    Call pt when ready    Return in about 4 months (around 07/09/2022) for HTN/T2DM.    This visit occurred during the SARS-CoV-2 public health emergency.  Safety protocols were in place, including screening questions prior to the visit, additional usage of staff PPE, and extensive cleaning of exam room while observing appropriate contact time as indicated for disinfecting solutions.

## 2022-03-09 NOTE — Assessment & Plan Note (Signed)
Blood sugars are better controlled.  Continue Toujeo at current strength.  Encouraged continued work on dietary changes and incorporation of regular activity.

## 2022-03-09 NOTE — Assessment & Plan Note (Signed)
He continues to have difficulty with quitting smoking.  He has been able to cut back but never completely quit.  Has tried multiple medications to help with this without success.  Counseled on continue to work on this.

## 2022-03-09 NOTE — Assessment & Plan Note (Signed)
Continue allopurinol at current strength.  Colchicine as needed for breakthrough

## 2022-04-12 DIAGNOSIS — I25810 Atherosclerosis of coronary artery bypass graft(s) without angina pectoris: Secondary | ICD-10-CM | POA: Diagnosis not present

## 2022-04-12 DIAGNOSIS — I4891 Unspecified atrial fibrillation: Secondary | ICD-10-CM | POA: Diagnosis not present

## 2022-04-12 DIAGNOSIS — Z720 Tobacco use: Secondary | ICD-10-CM | POA: Diagnosis not present

## 2022-04-12 DIAGNOSIS — I10 Essential (primary) hypertension: Secondary | ICD-10-CM | POA: Diagnosis not present

## 2022-05-07 DIAGNOSIS — I4891 Unspecified atrial fibrillation: Secondary | ICD-10-CM | POA: Diagnosis not present

## 2022-05-07 DIAGNOSIS — I25810 Atherosclerosis of coronary artery bypass graft(s) without angina pectoris: Secondary | ICD-10-CM | POA: Diagnosis not present

## 2022-05-07 DIAGNOSIS — I10 Essential (primary) hypertension: Secondary | ICD-10-CM | POA: Diagnosis not present

## 2022-06-08 ENCOUNTER — Telehealth: Payer: Self-pay | Admitting: Family Medicine

## 2022-06-08 MED ORDER — HYDROCODONE-ACETAMINOPHEN 10-325 MG PO TABS: 1.0000 | ORAL_TABLET | Freq: Three times a day (TID) | ORAL | 0 refills | Status: DC | PRN

## 2022-06-08 NOTE — Telephone Encounter (Signed)
Hydrocodone sent in.  He should still have an additional refill on metoprolol.   CM

## 2022-06-08 NOTE — Telephone Encounter (Signed)
Patient called requesting a refill of ;  Hydrocodone 10-325, Meoprolol 100mg   Pharmacy ;  [facility] [place]

## 2022-06-22 ENCOUNTER — Other Ambulatory Visit: Payer: Self-pay | Admitting: Family Medicine

## 2022-06-24 ENCOUNTER — Ambulatory Visit: Payer: BC Managed Care – PPO | Admitting: Family Medicine

## 2022-06-24 ENCOUNTER — Other Ambulatory Visit: Payer: Self-pay

## 2022-06-24 DIAGNOSIS — M5416 Radiculopathy, lumbar region: Secondary | ICD-10-CM

## 2022-06-24 DIAGNOSIS — E790 Hyperuricemia without signs of inflammatory arthritis and tophaceous disease: Secondary | ICD-10-CM

## 2022-06-24 DIAGNOSIS — I10 Essential (primary) hypertension: Secondary | ICD-10-CM

## 2022-06-24 DIAGNOSIS — G629 Polyneuropathy, unspecified: Secondary | ICD-10-CM

## 2022-06-24 DIAGNOSIS — E1140 Type 2 diabetes mellitus with diabetic neuropathy, unspecified: Secondary | ICD-10-CM

## 2022-06-24 MED ORDER — OMEPRAZOLE 20 MG PO CPDR: 20.0000 mg | DELAYED_RELEASE_CAPSULE | Freq: Every day | ORAL | 1 refills | Status: DC

## 2022-06-24 MED ORDER — NITROGLYCERIN 0.4 MG SL SUBL: 0.4000 mg | SUBLINGUAL_TABLET | SUBLINGUAL | 3 refills | Status: DC | PRN

## 2022-06-24 MED ORDER — COLCHICINE 0.6 MG PO TABS: ORAL_TABLET | ORAL | 1 refills | Status: DC

## 2022-06-24 MED ORDER — METOPROLOL SUCCINATE ER 50 MG PO TB24
50.0000 mg | ORAL_TABLET | Freq: Every day | ORAL | 1 refills | Status: DC
Start: 2022-06-24 — End: 2022-11-12

## 2022-06-24 MED ORDER — APIXABAN 5 MG PO TABS: 5.0000 mg | ORAL_TABLET | Freq: Two times a day (BID) | ORAL | 0 refills | Status: DC

## 2022-06-24 MED ORDER — LISINOPRIL-HYDROCHLOROTHIAZIDE 20-12.5 MG PO TABS
1.0000 | ORAL_TABLET | Freq: Every day | ORAL | 1 refills | Status: DC
Start: 2022-06-24 — End: 2022-11-12

## 2022-06-24 MED ORDER — HYDROCODONE-ACETAMINOPHEN 10-325 MG PO TABS: 1.0000 | ORAL_TABLET | Freq: Three times a day (TID) | ORAL | 0 refills | Status: DC | PRN

## 2022-06-24 MED ORDER — LEVOCETIRIZINE DIHYDROCHLORIDE 5 MG PO TABS: 5.0000 mg | ORAL_TABLET | Freq: Every day | ORAL | 1 refills | Status: DC

## 2022-06-24 MED ORDER — VARENICLINE TARTRATE 1 MG PO TABS: 1.0000 mg | ORAL_TABLET | Freq: Two times a day (BID) | ORAL | 2 refills | Status: DC

## 2022-06-24 MED ORDER — GABAPENTIN 300 MG PO CAPS: 300.0000 mg | ORAL_CAPSULE | Freq: Three times a day (TID) | ORAL | 1 refills | Status: DC

## 2022-06-24 MED ORDER — BD PEN NEEDLE NANO 2ND GEN 32G X 4 MM MISC: 0 refills | Status: DC

## 2022-06-24 MED ORDER — ALLOPURINOL 100 MG PO TABS: 100.0000 mg | ORAL_TABLET | Freq: Every day | ORAL | 1 refills | Status: DC

## 2022-06-24 MED ORDER — TOUJEO MAX SOLOSTAR 300 UNIT/ML ~~LOC~~ SOPN: 40.0000 [IU] | PEN_INJECTOR | Freq: Every day | SUBCUTANEOUS | 3 refills | Status: DC

## 2022-06-24 MED ORDER — TEMAZEPAM 30 MG PO CAPS: 30.0000 mg | ORAL_CAPSULE | Freq: Every evening | ORAL | 1 refills | Status: DC | PRN

## 2022-07-12 ENCOUNTER — Encounter: Payer: Self-pay | Admitting: Family Medicine

## 2022-07-12 ENCOUNTER — Ambulatory Visit: Payer: BC Managed Care – PPO | Admitting: Family Medicine

## 2022-07-12 VITALS — BP 113/61 | HR 67 | Ht 68.0 in | Wt 203.0 lb

## 2022-07-12 DIAGNOSIS — I2510 Atherosclerotic heart disease of native coronary artery without angina pectoris: Secondary | ICD-10-CM

## 2022-07-12 DIAGNOSIS — Z794 Long term (current) use of insulin: Secondary | ICD-10-CM

## 2022-07-12 DIAGNOSIS — E1140 Type 2 diabetes mellitus with diabetic neuropathy, unspecified: Secondary | ICD-10-CM

## 2022-07-12 DIAGNOSIS — F5101 Primary insomnia: Secondary | ICD-10-CM

## 2022-07-12 DIAGNOSIS — I10 Essential (primary) hypertension: Secondary | ICD-10-CM

## 2022-07-12 DIAGNOSIS — M5416 Radiculopathy, lumbar region: Secondary | ICD-10-CM | POA: Diagnosis not present

## 2022-07-12 DIAGNOSIS — E782 Mixed hyperlipidemia: Secondary | ICD-10-CM | POA: Diagnosis not present

## 2022-07-12 DIAGNOSIS — Z125 Encounter for screening for malignant neoplasm of prostate: Secondary | ICD-10-CM

## 2022-07-12 DIAGNOSIS — J449 Chronic obstructive pulmonary disease, unspecified: Secondary | ICD-10-CM

## 2022-07-12 DIAGNOSIS — I480 Paroxysmal atrial fibrillation: Secondary | ICD-10-CM

## 2022-07-12 LAB — POCT GLYCOSYLATED HEMOGLOBIN (HGB A1C): HbA1c, POC (controlled diabetic range): 8 % — AB (ref 0.0–7.0)

## 2022-07-12 NOTE — Patient Instructions (Signed)
Continue Toujeo.   Watch carbohydrate intake-look at food labels.  Limit to 45-50 grams of carbs with each meal.   Have fasting labs completed.  See me again in 4 months.    Diabetes Mellitus and Nutrition, Adult When you have diabetes, or diabetes mellitus, it is very important to have healthy eating habits because your blood sugar (glucose) levels are greatly affected by what you eat and drink. Eating healthy foods in the right amounts, at about the same times every day, can help you: Manage your blood glucose. Lower your risk of heart disease. Improve your blood pressure. Reach or maintain a healthy weight. What can affect my meal plan? Every person with diabetes is different, and each person has different needs for a meal plan. Your health care provider may recommend that you work with a dietitian to make a meal plan that is best for you. Your meal plan may vary depending on factors such as: The calories you need. The medicines you take. Your weight. Your blood glucose, blood pressure, and cholesterol levels. Your activity level. Other health conditions you have, such as heart or kidney disease. How do carbohydrates affect me? Carbohydrates, also called carbs, affect your blood glucose level more than any other type of food. Eating carbs raises the amount of glucose in your blood. It is important to know how many carbs you can safely have in each meal. This is different for every person. Your dietitian can help you calculate how many carbs you should have at each meal and for each snack. How does alcohol affect me? Alcohol can cause a decrease in blood glucose (hypoglycemia), especially if you use insulin or take certain diabetes medicines by mouth. Hypoglycemia can be a life-threatening condition. Symptoms of hypoglycemia, such as sleepiness, dizziness, and confusion, are similar to symptoms of having too much alcohol. Do not drink alcohol if: Your health care provider tells you not to  drink. You are pregnant, may be pregnant, or are planning to become pregnant. If you drink alcohol: Limit how much you have to: 0-1 drink a day for women. 0-2 drinks a day for men. Know how much alcohol is in your drink. In the [place] one drink equals one 12 oz bottle of beer (355 mL), one 5 oz glass of wine (148 mL), or one 1 oz glass of hard liquor (44 mL). Keep yourself hydrated with water, diet soda, or unsweetened iced tea. Keep in mind that regular soda, juice, and other mixers may contain a lot of sugar and must be counted as carbs. What are tips for following this plan?  Reading food labels Start by checking the serving size on the Nutrition Facts label of packaged foods and drinks. The number of calories and the amount of carbs, fats, and other nutrients listed on the label are based on one serving of the item. Many items contain more than one serving per package. Check the total grams (g) of carbs in one serving. Check the number of grams of saturated fats and trans fats in one serving. Choose foods that have a low amount or none of these fats. Check the number of milligrams (mg) of salt (sodium) in one serving. Most people should limit total sodium intake to less than 2,300 mg per day. Always check the nutrition information of foods labeled as "low-fat" or "nonfat." These foods may be higher in added sugar or refined carbs and should be avoided. Talk to your dietitian to identify your daily goals for nutrients listed  on the label. Shopping Avoid buying canned, pre-made, or processed foods. These foods tend to be high in fat, sodium, and added sugar. Shop around the outside edge of the grocery store. This is where you will most often find fresh fruits and vegetables, bulk grains, fresh meats, and fresh dairy products. Cooking Use low-heat cooking methods, such as baking, instead of high-heat cooking methods, such as deep frying. Cook using healthy oils, such as olive, canola, or  sunflower oil. Avoid cooking with butter, cream, or high-fat meats. Meal planning Eat meals and snacks regularly, preferably at the same times every day. Avoid going long periods of time without eating. Eat foods that are high in fiber, such as fresh fruits, vegetables, beans, and whole grains. Eat 4-6 oz (112-168 g) of lean protein each day, such as lean meat, chicken, fish, eggs, or tofu. One ounce (oz) (28 g) of lean protein is equal to: 1 oz (28 g) of meat, chicken, or fish. 1 egg.  cup (62 g) of tofu. Eat some foods each day that contain healthy fats, such as avocado, nuts, seeds, and fish. What foods should I eat? Fruits Berries. Apples. Oranges. Peaches. Apricots. Plums. Grapes. Mangoes. Papayas. Pomegranates. Kiwi. Cherries. Vegetables Leafy greens, including lettuce, spinach, kale, chard, collard greens, mustard greens, and cabbage. Beets. Cauliflower. Broccoli. Carrots. Green beans. Tomatoes. Peppers. Onions. Cucumbers. Brussels sprouts. Grains Whole grains, such as whole-wheat or whole-grain bread, crackers, tortillas, cereal, and pasta. Unsweetened oatmeal. Quinoa. Brown or wild rice. Meats and other proteins Seafood. Poultry without skin. Lean cuts of poultry and beef. Tofu. Nuts. Seeds. Dairy Low-fat or fat-free dairy products such as milk, yogurt, and cheese. The items listed above may not be a complete list of foods and beverages you can eat and drink. Contact a dietitian for more information. What foods should I avoid? Fruits Fruits canned with syrup. Vegetables Canned vegetables. Frozen vegetables with butter or cream sauce. Grains Refined white flour and flour products such as bread, pasta, snack foods, and cereals. Avoid all processed foods. Meats and other proteins Fatty cuts of meat. Poultry with skin. Breaded or fried meats. Processed meat. Avoid saturated fats. Dairy Full-fat yogurt, cheese, or milk. Beverages Sweetened drinks, such as soda or iced tea. The  items listed above may not be a complete list of foods and beverages you should avoid. Contact a dietitian for more information. Questions to ask a health care provider Do I need to meet with a certified diabetes care and education specialist? Do I need to meet with a dietitian? What number can I call if I have questions? When are the best times to check my blood glucose? Where to find more information: American Diabetes Association: diabetes.org Academy of Nutrition and Dietetics: eatright.[employer] of Diabetes and Digestive and Kidney Diseases: StageSync.si Association of Diabetes Care & Education Specialists: diabeteseducator.org Summary It is important to have healthy eating habits because your blood sugar (glucose) levels are greatly affected by what you eat and drink. It is important to use alcohol carefully. A healthy meal plan will help you manage your blood glucose and lower your risk of heart disease. Your health care provider may recommend that you work with a dietitian to make a meal plan that is best for you. This information is not intended to replace advice given to you by your health care provider. Make sure you discuss any questions you have with your health care provider. Document Revised: 07/25/2019 Document Reviewed: 07/25/2019 Elsevier Patient Education  2024 [employer].

## 2022-07-12 NOTE — Assessment & Plan Note (Signed)
Seen by cardiology.  Stable with metoprolol and anticoagulated with Eliquis.

## 2022-07-12 NOTE — Assessment & Plan Note (Signed)
Diabetes control is about the same as last visit.  He does not want to make further changes to medications.  Encouraged dietary changes.  F/u in 4 months.

## 2022-07-12 NOTE — Assessment & Plan Note (Signed)
History of MI.  He is not on a statin.  Updated lipid panel, he will return for this when fasting.

## 2022-07-12 NOTE — Assessment & Plan Note (Signed)
He will remain on Temazepam

## 2022-07-12 NOTE — Assessment & Plan Note (Signed)
BP remains well controlled.  Continue current medications for management of HTN.   

## 2022-07-12 NOTE — Assessment & Plan Note (Addendum)
He remains on norco prn.  Gets good relief from this.  No side effects noted. Updated pain contract signed.  UDS ordered.

## 2022-07-12 NOTE — Progress Notes (Signed)
Vincent Wall - 62 y.o. male MRN [id number]  Date of birth: [birth year]  Subjective Chief Complaint  Patient presents with   Hypertension   Diabetes    HPI Vincent Wall is a 62 y.o. male here today for follow up visit.    He reports that he is doing ok.  He continues to see cardiology for history of MI and A. Fib.  Continues on Apixaban for stroke prevention.  He is not currently on a statin due to intolerance.  Due for updated labs to re-assess lipids, does not appear that his cardiologist has checked this recently or discussed statin alternatives.  He does continue to smoke.  BP remains well controlled with lisinopril/hydrochlorothiazide at current strength.  He has been managing his diabetes with toujeo 40 units daily.  A1c improved at last visit.  He denies episodes of hypoglycemia.    Using temazepam as needed for sleep.  This continues to work well for him.   Has norco and gabapentin for chronic low back pain.  No significant side effects or complications related to chronic opioids.  Due for updated UDS and pain contract.   ROS:  A comprehensive ROS was completed and negative except as noted per HPI  Allergies  Allergen Reactions   Jardiance [Empagliflozin] Diarrhea   Citalopram Hydrobromide     GI   Crestor [Rosuvastatin]     Myalgia     Lipitor [Atorvastatin]     Myalgias   Metformin And Related Diarrhea   Nabumetone     GI   Viagra  [Sildenafil Citrate]     GI upset    Past Medical History:  Diagnosis Date   Elevated uric acid in blood 06/16/2018   Heart attack (HCC)    Heart disease    Hyperlipidemia    Hypertension     Past Surgical History:  Procedure Laterality Date   CORONARY STENT PLACEMENT  05/2010   ELBOW SURGERY  2010    Social History   Socioeconomic History   Marital status: Married    Spouse name: Not on file   Number of children: Not on file   Years of education: Not on file   Highest education level: Not on file  Occupational History    Not on file  Tobacco Use   Smoking status: Every Day    Packs/day: 1.00    Years: 20.00    Additional pack years: 0.00    Total pack years: 20.00    Types: Cigarettes   Smokeless tobacco: Never  Substance and Sexual Activity   Alcohol use: Not Currently    Alcohol/week: 1.0 standard drink of alcohol    Types: 1 Standard drinks or equivalent per week   Drug use: No   Sexual activity: Yes    Partners: Female  Other Topics Concern   Not on file  Social History Narrative   Not on file   Social Determinants of Health   Financial Resource Strain: Not on file  Food Insecurity: Not on file  Transportation Needs: Not on file  Physical Activity: Not on file  Stress: Not on file  Social Connections: Not on file    Family History  Problem Relation Age of Onset   Breast cancer Unknown    Heart attack Unknown    Diabetes Unknown    Hyperlipidemia Unknown    Hypertension Unknown     Health Maintenance  Topic Date Due   Diabetic kidney evaluation - eGFR measurement  11/04/2021   Lung Cancer  Screening  11/26/2022 (Originally 01/06/2008)   Colonoscopy  01/05/2023 (Originally 01/06/2003)   COVID-19 Vaccine (3 - 2023-24 season) 03/25/2023 (Originally 09/04/2021)   Zoster Vaccines- Shingrix (1 of 2) 06/09/2023 (Originally 01/06/2008)   INFLUENZA VACCINE  08/05/2022   FOOT EXAM  08/05/2022   OPHTHALMOLOGY EXAM  09/03/2022   HEMOGLOBIN A1C  01/12/2023   Diabetic kidney evaluation - Urine ACR  03/09/2023   DTaP/Tdap/Td (3 - Td or Tdap) 04/25/2030   Hepatitis C Screening  Completed   HPV VACCINES  Aged Out   HIV Screening  Discontinued     ----------------------------------------------------------------------------------------------------------------------------------------------------------------------------------------------------------------- Physical Exam BP 113/61 (BP Location: Left Arm, Patient Position: Sitting, Cuff Size: Normal)   Pulse 67   Ht 5\' 8"  (1.727 m)   Wt 203 lb  (92.1 kg)   SpO2 97%   BMI 30.87 kg/m   Physical Exam Constitutional:      Appearance: Normal appearance.  HENT:     Head: Normocephalic and atraumatic.  Eyes:     General: No scleral icterus. Cardiovascular:     Rate and Rhythm: Normal rate and regular rhythm.  Pulmonary:     Effort: Pulmonary effort is normal.     Breath sounds: Normal breath sounds.  Neurological:     Mental Status: He is alert.  Psychiatric:        Mood and Affect: Mood normal.        Behavior: Behavior normal.     ------------------------------------------------------------------------------------------------------------------------------------------------------------------------------------------------------------------- Assessment and Plan  Type 2 diabetes mellitus with diabetic neuropathy, unspecified (HCC) Diabetes control is about the same as last visit.  He does not want to make further changes to medications.  Encouraged dietary changes.  F/u in 4 months.   COPD (chronic obstructive pulmonary disease) (HCC) Counseled on smoking cessation.   Essential hypertension, benign BP remains well controlled. Continue current medications for management of HTN.   PAF (paroxysmal atrial fibrillation) (HCC) Seen by cardiology.  Stable with metoprolol and anticoagulated with Eliquis.   CAD (coronary artery disease) History of MI.  He is not on a statin.  Updated lipid panel, he will return for this when fasting.   Insomnia He will remain on Temazepam  Lumbar radiculopathy He remains on norco prn.  Gets good relief from this.  No side effects noted. Updated pain contract signed.  UDS ordered.    No orders of the defined types were placed in this encounter.   Return in about 4 months (around 11/12/2022) for T2DM.    This visit occurred during the SARS-CoV-2 public health emergency.  Safety protocols were in place, including screening questions prior to the visit, additional usage of staff PPE, and  extensive cleaning of exam room while observing appropriate contact time as indicated for disinfecting solutions.

## 2022-07-12 NOTE — Assessment & Plan Note (Signed)
Counseled on smoking cessation  

## 2022-09-09 ENCOUNTER — Other Ambulatory Visit: Payer: Self-pay | Admitting: Family Medicine

## 2022-09-16 DIAGNOSIS — I25810 Atherosclerosis of coronary artery bypass graft(s) without angina pectoris: Secondary | ICD-10-CM | POA: Diagnosis not present

## 2022-09-16 DIAGNOSIS — I10 Essential (primary) hypertension: Secondary | ICD-10-CM | POA: Diagnosis not present

## 2022-09-16 DIAGNOSIS — I4891 Unspecified atrial fibrillation: Secondary | ICD-10-CM | POA: Diagnosis not present

## 2022-11-12 ENCOUNTER — Ambulatory Visit: Payer: BC Managed Care – PPO

## 2022-11-12 ENCOUNTER — Ambulatory Visit: Payer: BC Managed Care – PPO | Admitting: Family Medicine

## 2022-11-12 ENCOUNTER — Encounter: Payer: Self-pay | Admitting: Family Medicine

## 2022-11-12 VITALS — BP 113/59 | HR 70 | Resp 20 | Ht 68.0 in | Wt 208.0 lb

## 2022-11-12 DIAGNOSIS — M4802 Spinal stenosis, cervical region: Secondary | ICD-10-CM | POA: Diagnosis not present

## 2022-11-12 DIAGNOSIS — M5412 Radiculopathy, cervical region: Secondary | ICD-10-CM

## 2022-11-12 DIAGNOSIS — M48061 Spinal stenosis, lumbar region without neurogenic claudication: Secondary | ICD-10-CM | POA: Diagnosis not present

## 2022-11-12 DIAGNOSIS — M47816 Spondylosis without myelopathy or radiculopathy, lumbar region: Secondary | ICD-10-CM | POA: Diagnosis not present

## 2022-11-12 DIAGNOSIS — M5010 Cervical disc disorder with radiculopathy, unspecified cervical region: Secondary | ICD-10-CM | POA: Diagnosis not present

## 2022-11-12 DIAGNOSIS — E790 Hyperuricemia without signs of inflammatory arthritis and tophaceous disease: Secondary | ICD-10-CM

## 2022-11-12 DIAGNOSIS — E782 Mixed hyperlipidemia: Secondary | ICD-10-CM | POA: Diagnosis not present

## 2022-11-12 DIAGNOSIS — M50323 Other cervical disc degeneration at C6-C7 level: Secondary | ICD-10-CM

## 2022-11-12 DIAGNOSIS — Z125 Encounter for screening for malignant neoplasm of prostate: Secondary | ICD-10-CM

## 2022-11-12 DIAGNOSIS — I2510 Atherosclerotic heart disease of native coronary artery without angina pectoris: Secondary | ICD-10-CM

## 2022-11-12 DIAGNOSIS — I480 Paroxysmal atrial fibrillation: Secondary | ICD-10-CM

## 2022-11-12 DIAGNOSIS — M5137 Other intervertebral disc degeneration, lumbosacral region with discogenic back pain only: Secondary | ICD-10-CM | POA: Diagnosis not present

## 2022-11-12 DIAGNOSIS — M5416 Radiculopathy, lumbar region: Secondary | ICD-10-CM

## 2022-11-12 DIAGNOSIS — M5136 Other intervertebral disc degeneration, lumbar region with discogenic back pain only: Secondary | ICD-10-CM | POA: Diagnosis not present

## 2022-11-12 DIAGNOSIS — Z79891 Long term (current) use of opiate analgesic: Secondary | ICD-10-CM

## 2022-11-12 DIAGNOSIS — I10 Essential (primary) hypertension: Secondary | ICD-10-CM

## 2022-11-12 DIAGNOSIS — M50322 Other cervical disc degeneration at C5-C6 level: Secondary | ICD-10-CM | POA: Diagnosis not present

## 2022-11-12 DIAGNOSIS — Z720 Tobacco use: Secondary | ICD-10-CM

## 2022-11-12 DIAGNOSIS — Z794 Long term (current) use of insulin: Secondary | ICD-10-CM

## 2022-11-12 DIAGNOSIS — E1140 Type 2 diabetes mellitus with diabetic neuropathy, unspecified: Secondary | ICD-10-CM

## 2022-11-12 DIAGNOSIS — M4722 Other spondylosis with radiculopathy, cervical region: Secondary | ICD-10-CM | POA: Diagnosis not present

## 2022-11-12 LAB — POCT GLYCOSYLATED HEMOGLOBIN (HGB A1C)
HbA1c, POC (controlled diabetic range): 10.9 % — AB (ref 0.0–7.0)
Hemoglobin A1C: 10.9 % — AB (ref 4.0–5.6)

## 2022-11-12 MED ORDER — PREGABALIN 75 MG PO CAPS: 75.0000 mg | ORAL_CAPSULE | Freq: Two times a day (BID) | ORAL | 0 refills | Status: DC

## 2022-11-12 MED ORDER — TEMAZEPAM 30 MG PO CAPS: 30.0000 mg | ORAL_CAPSULE | Freq: Every evening | ORAL | 1 refills | Status: DC | PRN

## 2022-11-12 MED ORDER — ALLOPURINOL 100 MG PO TABS: 100.0000 mg | ORAL_TABLET | Freq: Every day | ORAL | 1 refills | Status: DC

## 2022-11-12 MED ORDER — TOUJEO MAX SOLOSTAR 300 UNIT/ML ~~LOC~~ SOPN: 50.0000 [IU] | PEN_INJECTOR | Freq: Every day | SUBCUTANEOUS | 1 refills | Status: DC

## 2022-11-12 MED ORDER — METOPROLOL SUCCINATE ER 50 MG PO TB24: 50.0000 mg | ORAL_TABLET | Freq: Every day | ORAL | 1 refills | Status: DC

## 2022-11-12 MED ORDER — OMEPRAZOLE 20 MG PO CPDR: 20.0000 mg | DELAYED_RELEASE_CAPSULE | Freq: Every day | ORAL | 1 refills | Status: DC

## 2022-11-12 MED ORDER — LEVOCETIRIZINE DIHYDROCHLORIDE 5 MG PO TABS: 5.0000 mg | ORAL_TABLET | Freq: Every day | ORAL | 1 refills | Status: DC

## 2022-11-12 MED ORDER — HYDROCODONE-ACETAMINOPHEN 10-325 MG PO TABS: 1.0000 | ORAL_TABLET | Freq: Three times a day (TID) | ORAL | 0 refills | Status: DC | PRN

## 2022-11-12 MED ORDER — NITROGLYCERIN 0.4 MG SL SUBL: 0.4000 mg | SUBLINGUAL_TABLET | SUBLINGUAL | 3 refills | Status: AC | PRN

## 2022-11-12 MED ORDER — BD PEN NEEDLE NANO 2ND GEN 32G X 4 MM MISC: 0 refills | Status: DC

## 2022-11-12 MED ORDER — LISINOPRIL-HYDROCHLOROTHIAZIDE 20-12.5 MG PO TABS: 1.0000 | ORAL_TABLET | Freq: Every day | ORAL | 1 refills | Status: DC

## 2022-11-12 MED ORDER — VARENICLINE TARTRATE 1 MG PO TABS: 1.0000 mg | ORAL_TABLET | Freq: Two times a day (BID) | ORAL | 2 refills | Status: AC

## 2022-11-12 MED ORDER — TOUJEO MAX SOLOSTAR 300 UNIT/ML ~~LOC~~ SOPN
50.0000 [IU] | PEN_INJECTOR | Freq: Every day | SUBCUTANEOUS | 1 refills | Status: DC
Start: 2022-11-12 — End: 2022-11-12

## 2022-11-12 NOTE — Assessment & Plan Note (Signed)
We had discussion today regarding his continued risk of heart attack and stroke.  His cardiologist has recommended repatha for management of lipids due to his refusal of statins.  He does not want to add this at this time.  He understands risks of continued untreated cholesterol in addition to his poorly controlled diabetes.

## 2022-11-12 NOTE — Assessment & Plan Note (Signed)
Seen by cardiology.  Stable with metoprolol and anticoagulated with Eliquis.

## 2022-11-12 NOTE — Patient Instructions (Signed)
Stop gabapentin.   Start pregabalin for neuropathy.   Increase insulin to 50 units daily.

## 2022-11-12 NOTE — Progress Notes (Signed)
Vincent Wall - 62 y.o. male MRN [id number]  Date of birth: [birth year]  Subjective Chief Complaint  Patient presents with   Medical Management of Chronic Issues   Diabetes   Hypertension    HPI Vincent Wall is a 62 y.o. male here today for follow up visit.   He reports that he is doing ok.    Remains on toujeo for management of T2DM.  He is not checking blood sugars at home. He denies symptoms of hypoglycemia.    He has pain in feet and hands from neuropathy.  Currently taking gabapentin.    Having increased low back pain along with neck and back pain.  Radiation of pain into the R arm and L leg.  He does have chronic norco that he uses for pain as well as the gabapentin.  Positional changes such as playing golf tend to make his back pain worse.   BP is managed with lisinopril/hydrochlorothiazide.  Also on metoprolol for rate control related to A. Fib.  BP is well controlled.  He is anticoagulated with Eliquis and tolerating this well. Cholesterol elevated on recent labs with cardiology.  Repatha recommended by cardiology, however he declined.  He refuses to take statins.   ROS:  A comprehensive ROS was completed and negative except as noted per HPI  Allergies  Allergen Reactions   Jardiance [Empagliflozin] Diarrhea   Citalopram Hydrobromide     GI   Crestor [Rosuvastatin]     Myalgia     Lipitor [Atorvastatin]     Myalgias   Metformin And Related Diarrhea   Nabumetone     GI   Viagra  [Sildenafil Citrate]     GI upset    Past Medical History:  Diagnosis Date   Elevated uric acid in blood 06/16/2018   Heart attack (HCC)    Heart disease    Hyperlipidemia    Hypertension     Past Surgical History:  Procedure Laterality Date   CORONARY STENT PLACEMENT  05/2010   ELBOW SURGERY  2010    Social History   Socioeconomic History   Marital status: Married    Spouse name: Not on file   Number of children: Not on file   Years of education: Not on file   Highest  education level: Not on file  Occupational History   Not on file  Tobacco Use   Smoking status: Every Day    Current packs/day: 1.00    Average packs/day: 1 pack/day for 20.0 years (20.0 ttl pk-yrs)    Types: Cigarettes   Smokeless tobacco: Never  Substance and Sexual Activity   Alcohol use: Not Currently    Alcohol/week: 1.0 standard drink of alcohol    Types: 1 Standard drinks or equivalent per week   Drug use: No   Sexual activity: Yes    Partners: Female  Other Topics Concern   Not on file  Social History Narrative   Not on file   Social Determinants of Health   Financial Resource Strain: Low Risk  (09/16/2022)   Received from [facility]   Overall Financial Resource Strain (CARDIA)    Difficulty of Paying Living Expenses: Not hard at all  Food Insecurity: No Food Insecurity (09/16/2022)   Received from [facility]   Hunger Vital Sign    Worried About Running Out of Food in the Last Year: Never true    Ran Out of Food in the Last Year: Never true  Transportation Needs: No Transportation Needs (09/16/2022)  Received from [facility] - Transportation    Lack of Transportation (Medical): No    Lack of Transportation (Non-Medical): No  Physical Activity: Not on file  Stress: Stress Concern Present (09/17/2020)   Received from [facility], [facility] - Occupational Stress Questionnaire    Feeling of Stress : To some extent  Social Connections: Unknown (05/04/2021)   Received from [facility], [facility]   Social Network    Social Network: Not on file    Family History  Problem Relation Age of Onset   Breast cancer Unknown    Heart attack Unknown    Diabetes Unknown    Hyperlipidemia Unknown    Hypertension Unknown     Health Maintenance  Topic Date Due   Diabetic kidney evaluation - eGFR measurement  11/04/2021   FOOT EXAM  08/05/2022   OPHTHALMOLOGY EXAM  09/03/2022   Lung Cancer Screening   11/26/2022 (Originally 01/06/2008)   COVID-19 Vaccine (3 - 2023-24 season) 11/28/2022 (Originally 09/05/2022)   Colonoscopy  01/05/2023 (Originally 01/06/2003)   INFLUENZA VACCINE  04/04/2023 (Originally 08/05/2022)   Zoster Vaccines- Shingrix (1 of 2) 06/09/2023 (Originally 01/06/2008)   HEMOGLOBIN A1C  01/12/2023   Diabetic kidney evaluation - Urine ACR  03/09/2023   DTaP/Tdap/Td (3 - Td or Tdap) 04/25/2030   Hepatitis C Screening  Completed   HPV VACCINES  Aged Out   HIV Screening  Discontinued     ----------------------------------------------------------------------------------------------------------------------------------------------------------------------------------------------------------------- Physical Exam BP (!) 113/59 (BP Location: Left Arm, Cuff Size: Normal)   Pulse 70   Resp 20   Ht 5\' 8"  (1.727 m)   Wt 208 lb (94.3 kg)   SpO2 97%   BMI 31.63 kg/m   Physical Exam Constitutional:      Appearance: Normal appearance.  HENT:     Head: Normocephalic and atraumatic.  Eyes:     General: No scleral icterus. Cardiovascular:     Rate and Rhythm: Normal rate and regular rhythm.  Pulmonary:     Effort: Pulmonary effort is normal.     Breath sounds: Normal breath sounds.  Neurological:     Mental Status: He is alert.  Psychiatric:        Mood and Affect: Mood normal.        Behavior: Behavior normal.     ------------------------------------------------------------------------------------------------------------------------------------------------------------------------------------------------------------------- Assessment and Plan  Type 2 diabetes mellitus with diabetic neuropathy, unspecified (HCC) Diabetes is poorly controlled.  Encouraged dietary changes.  Increase toujeo to 50 units.  He has not tolerated GLP-1 in the past.  Doesn't want to do multiple injections with meal time insulin.  Refuses referral to endocrinology.   Tobacco use Continues to smoke, has  been able to cut back some.   PAF (paroxysmal atrial fibrillation) (HCC) Seen by cardiology.  Stable with metoprolol and anticoagulated with Eliquis.   Lumbar radiculopathy He remains on norco prn.  Gets good relief from this.  No side effects noted. Updated pain contract on file  UDS ordered.   CAD (coronary artery disease) We had discussion today regarding his continued risk of heart attack and stroke.  His cardiologist has recommended repatha for management of lipids due to his refusal of statins.  He does not want to add this at this time.  He understands risks of continued untreated cholesterol in addition to his poorly controlled diabetes.     Meds ordered this encounter  Medications   DISCONTD: allopurinol (ZYLOPRIM) 100 MG tablet  Sig: Take 1 tablet (100 mg total) by mouth daily.    Dispense:  90 tablet    Refill:  1   DISCONTD: HYDROcodone-acetaminophen (NORCO) 10-325 MG tablet    Sig: Take 1 tablet by mouth every 8 (eight) hours as needed.    Dispense:  60 tablet    Refill:  0    Call pt when ready   DISCONTD: insulin glargine, 2 Unit Dial, (TOUJEO MAX SOLOSTAR) 300 UNIT/ML Solostar Pen    Sig: Inject 50 Units into the skin daily.    Dispense:  15 mL    Refill:  1   DISCONTD: lisinopril-hydrochlorothiazide (ZESTORETIC) 20-12.5 MG tablet    Sig: Take 1 tablet by mouth daily.    Dispense:  90 tablet    Refill:  1   DISCONTD: metoprolol succinate (TOPROL-XL) 50 MG 24 hr tablet    Sig: Take 1 tablet (50 mg total) by mouth daily with breakfast. Take with or immediately following a meal.    Dispense:  90 tablet    Refill:  1   DISCONTD: temazepam (RESTORIL) 30 MG capsule    Sig: Take 1 capsule (30 mg total) by mouth at bedtime as needed for sleep.    Dispense:  90 capsule    Refill:  1    Call pt when ready   pregabalin (LYRICA) 75 MG capsule    Sig: Take 1 capsule (75 mg total) by mouth 2 (two) times daily.    Dispense:  180 capsule    Refill:  0   allopurinol  (ZYLOPRIM) 100 MG tablet    Sig: Take 1 tablet (100 mg total) by mouth daily.    Dispense:  90 tablet    Refill:  1   HYDROcodone-acetaminophen (NORCO) 10-325 MG tablet    Sig: Take 1 tablet by mouth every 8 (eight) hours as needed.    Dispense:  60 tablet    Refill:  0    Call pt when ready   insulin glargine, 2 Unit Dial, (TOUJEO MAX SOLOSTAR) 300 UNIT/ML Solostar Pen    Sig: Inject 50 Units into the skin daily.    Dispense:  15 mL    Refill:  1   lisinopril-hydrochlorothiazide (ZESTORETIC) 20-12.5 MG tablet    Sig: Take 1 tablet by mouth daily.    Dispense:  90 tablet    Refill:  1   metoprolol succinate (TOPROL-XL) 50 MG 24 hr tablet    Sig: Take 1 tablet (50 mg total) by mouth daily with breakfast. Take with or immediately following a meal.    Dispense:  90 tablet    Refill:  1   temazepam (RESTORIL) 30 MG capsule    Sig: Take 1 capsule (30 mg total) by mouth at bedtime as needed for sleep.    Dispense:  90 capsule    Refill:  1    Call pt when ready    Return in about 4 months (around 03/12/2023) for Type 2 Diabetes.    This visit occurred during the SARS-CoV-2 public health emergency.  Safety protocols were in place, including screening questions prior to the visit, additional usage of staff PPE, and extensive cleaning of exam room while observing appropriate contact time as indicated for disinfecting solutions.

## 2022-11-12 NOTE — Assessment & Plan Note (Signed)
He remains on norco prn.  Gets good relief from this.  No side effects noted. Updated pain contract on file  UDS ordered.

## 2022-11-12 NOTE — Assessment & Plan Note (Signed)
Continues to smoke, has been able to cut back some.

## 2022-11-12 NOTE — Addendum Note (Signed)
Addended by: Mammie Lorenzo on: 11/12/2022 09:39 AM   Modules accepted: Orders

## 2022-11-12 NOTE — Addendum Note (Signed)
Addended by: Latanya Presser on: 11/12/2022 09:23 AM   Modules accepted: Orders

## 2022-11-12 NOTE — Assessment & Plan Note (Signed)
Diabetes is poorly controlled.  Encouraged dietary changes.  Increase toujeo to 50 units.  He has not tolerated GLP-1 in the past.  Doesn't want to do multiple injections with meal time insulin.  Refuses referral to endocrinology.

## 2022-11-15 ENCOUNTER — Other Ambulatory Visit: Payer: Self-pay

## 2022-11-15 MED ORDER — TEMAZEPAM 30 MG PO CAPS: 30.0000 mg | ORAL_CAPSULE | Freq: Every evening | ORAL | 1 refills | Status: DC | PRN

## 2022-12-14 ENCOUNTER — Telehealth: Payer: Self-pay

## 2022-12-14 NOTE — Telephone Encounter (Signed)
Vincent Wall states he is still having leg pain and swelling. He is worried about a blood clot.   He is wanting to know the next steps.

## 2022-12-17 NOTE — Telephone Encounter (Signed)
Denies increased swelling. He cannot come in today. He is wanting an appointment for Monday. Patient scheduled.

## 2022-12-20 ENCOUNTER — Ambulatory Visit: Payer: BC Managed Care – PPO | Admitting: Family Medicine

## 2022-12-20 ENCOUNTER — Encounter: Payer: Self-pay | Admitting: Family Medicine

## 2022-12-20 VITALS — BP 129/77 | HR 74 | Temp 98.0°F | Ht 68.0 in | Wt 204.0 lb

## 2022-12-20 DIAGNOSIS — I739 Peripheral vascular disease, unspecified: Secondary | ICD-10-CM | POA: Insufficient documentation

## 2022-12-20 MED ORDER — PREGABALIN 150 MG PO CAPS: 150.0000 mg | ORAL_CAPSULE | Freq: Two times a day (BID) | ORAL | 3 refills | Status: DC

## 2022-12-20 MED ORDER — HYDROCODONE-ACETAMINOPHEN 10-325 MG PO TABS: 1.0000 | ORAL_TABLET | Freq: Three times a day (TID) | ORAL | 0 refills | Status: DC | PRN

## 2022-12-20 NOTE — Assessment & Plan Note (Signed)
Neurologic versus vascular claudication.  He does have diminished pulses and ABIs have been ordered.  We discussed that if ABIs are normal then we will proceed with MRI of the lumbar spine to evaluate for worsening spinal stenosis related to degenerative disc disease.  I will have him increase his Lyrica to 150 mg twice daily continue Norco as needed.

## 2022-12-20 NOTE — Patient Instructions (Signed)
Increase pregabalin to 150mg  twice per day.  Norco as needed.    Ankle-Brachial Index Test Why am I having this test? The ankle-brachial index (ABI) test is used to diagnose peripheral vascular disease (PVD). PVD is also known as peripheral arterial disease (PAD). PVD is the blocking or hardening of the arteries anywhere within the circulatory system beyond the heart. It is a form of cardiovascular disease. PVD is caused by: Cholesterol deposits in your blood vessels (atherosclerosis). This is the most common cause of this condition. Inflammation in the blood vessels. Blood clots in the vessels. Cholesterol deposits cause arteries to narrow. Decreased delivery of oxygen to your tissues causes muscle pain, cramping, and fatigue that occurs during exercise and is relieved by rest. This is called intermittent claudication. PVD means that there may also be buildup of cholesterol: In your heart. This raises the risk of heart attacks. In your brain. This raises the risk of strokes. What is being tested? The ankle-brachial index test measures the blood flow in your arms and legs. The blood flow will show if blood vessels in your legs have been narrowed by cholesterol deposits. How do I prepare for this test? Wear loose, comfortable clothing with shoes that are easy to remove. Do not use any products that contain nicotine or tobacco for at least 30 minutes before the test. These products include cigarettes, chewing tobacco, and vaping devices, such as e-cigarettes. Avoid caffeine, alcohol, and heavy activity an hour before the test. What happens during the test?  You will lie down in a resting position with your legs and arms at heart level. Your health care provider will use a blood pressure machine and a small ultrasound device (Doppler) to measure the systolic pressures on your upper arms and ankles. Systolic pressure is the pressure inside your arteries when your heart pumps. Systolic pressures  will be taken several times, and at several points, on both the ankle and the arm. Your health care provider may also measure your toe. Your health care provider will divide the highest systolic pressure of the ankle by the highest systolic pressure of the arm. The result is the ankle-brachial pressure ratio, or ABI. Sometimes this test will be repeated after you have exercised on a treadmill for 5 minutes. You may have leg pain during the exercise portion of the test if you suffer from PVD. If the index number drops after exercise, this may show that PVD is present. How are the results reported? Your test results will be reported as a value that shows the ratio of your ankle pressure to your arm pressure (ABI ratio). Your health care provider will compare your results to normal ranges that were established after testing a large group of people (reference ranges). These ranges may vary among labs and hospitals. For this test, an abnormal reading is typically considered, which is an ABI ratio of 0.9 or less. What do the results mean? An ABI ratio that is below the reference range is considered abnormal and may indicate PVD in the legs. Talk with your health care provider about what your results mean. Questions to ask your health care provider Ask your health care provider, or the department that is doing the test: When will my results be ready? How will I get my results? What are my treatment options? What other tests do I need? What are my next steps? Summary The ankle-brachial index (ABI) test is used to diagnose peripheral vascular disease (PVD). PVD is also known as peripheral  arterial disease (PAD). The ABI test measures the blood flow in your arms and legs. The highest systolic pressure of the ankle is divided by the highest systolic pressure of the arm. The result is the ABI ratio. An ABI ratio of 0.9 or less is considered abnormal and may indicate PVD in the legs. This information is not  intended to replace advice given to you by your health care provider. Make sure you discuss any questions you have with your health care provider. Document Revised: 05/29/2019 Document Reviewed: 06/25/2019 Elsevier Patient Education  2024 [employer].

## 2022-12-20 NOTE — Progress Notes (Signed)
Vincent Wall - 62 y.o. male MRN [id number]  Date of birth: 03-21-1960  Subjective Chief Complaint  Patient presents with   Leg Pain    Since March    HPI Vincent Wall is a 62 y.o. male here today with complaint of leg pain and swelling.  Pain in located in the left lower extremity.  Feels like he has tightness in the calf at times.  Sometimes it bothers him when he has on his feet for longer periods of time or walking for longer periods of time.  Denies increased numbness or tingling.  He does have history of lumbar degenerative disc disease.  Also has history of CAD  ROS:  A comprehensive ROS was completed and negative except as noted per HPI  Allergies  Allergen Reactions   Jardiance [Empagliflozin] Diarrhea   Citalopram Hydrobromide     GI   Crestor [Rosuvastatin]     Myalgia     Lipitor [Atorvastatin]     Myalgias   Metformin And Related Diarrhea   Nabumetone     GI   Viagra  [Sildenafil Citrate]     GI upset    Past Medical History:  Diagnosis Date   Elevated uric acid in blood 06/16/2018   Heart attack (HCC)    Heart disease    Hyperlipidemia    Hypertension     Past Surgical History:  Procedure Laterality Date   CORONARY STENT PLACEMENT  05/2010   ELBOW SURGERY  2010    Social History   Socioeconomic History   Marital status: Married    Spouse name: Not on file   Number of children: Not on file   Years of education: Not on file   Highest education level: Not on file  Occupational History   Not on file  Tobacco Use   Smoking status: Every Day    Current packs/day: 1.00    Average packs/day: 1 pack/day for 20.0 years (20.0 ttl pk-yrs)    Types: Cigarettes   Smokeless tobacco: Never  Substance and Sexual Activity   Alcohol use: Not Currently    Alcohol/week: 1.0 standard drink of alcohol    Types: 1 Standard drinks or equivalent per week   Drug use: No   Sexual activity: Yes    Partners: Female  Other Topics Concern   Not on file  Social History  Narrative   Not on file   Social Drivers of Health   Financial Resource Strain: Low Risk  (09/16/2022)   Received from [facility]   Overall Financial Resource Strain (CARDIA)    Difficulty of Paying Living Expenses: Not hard at all  Food Insecurity: No Food Insecurity (09/16/2022)   Received from [facility]   Hunger Vital Sign    Worried About Running Out of Food in the Last Year: Never true    Ran Out of Food in the Last Year: Never true  Transportation Needs: No Transportation Needs (09/16/2022)   Received from [facility] - Transportation    Lack of Transportation (Medical): No    Lack of Transportation (Non-Medical): No  Physical Activity: Not on file  Stress: Stress Concern Present (09/17/2020)   Received from [facility], [facility] - Occupational Stress Questionnaire    Feeling of Stress : To some extent  Social Connections: Unknown (05/04/2021)   Received from [facility], [facility]   Social Network    Social Network: Not on file  Family History  Problem Relation Age of Onset   Breast cancer Unknown    Heart attack Unknown    Diabetes Unknown    Hyperlipidemia Unknown    Hypertension Unknown     Health Maintenance  Topic Date Due   Lung Cancer Screening  Never done   Diabetic kidney evaluation - eGFR measurement  11/04/2021   FOOT EXAM  08/05/2022   OPHTHALMOLOGY EXAM  09/03/2022   Colonoscopy  01/05/2023 (Originally 01/06/2003)   COVID-19 Vaccine (3 - 2024-25 season) 01/05/2023 (Originally 09/05/2022)   INFLUENZA VACCINE  04/04/2023 (Originally 08/05/2022)   Zoster Vaccines- Shingrix (1 of 2) 06/09/2023 (Originally 01/06/2008)   Diabetic kidney evaluation - Urine ACR  03/09/2023   HEMOGLOBIN A1C  05/12/2023   DTaP/Tdap/Td (3 - Td or Tdap) 04/25/2030   Hepatitis C Screening  Completed   HPV VACCINES  Aged Out   HIV Screening  Discontinued      ----------------------------------------------------------------------------------------------------------------------------------------------------------------------------------------------------------------- Physical Exam BP 129/77 (BP Location: Left Arm, Patient Position: Sitting, Cuff Size: Normal)   Pulse 74   Temp 98 F (36.7 C) (Oral)   Ht 5\' 8"  (1.727 m)   Wt 204 lb 0.6 oz (92.6 kg)   SpO2 98%   BMI 31.02 kg/m   Physical Exam Constitutional:      Appearance: Normal appearance.  HENT:     Head: Normocephalic and atraumatic.  Eyes:     General: No scleral icterus. Cardiovascular:     Rate and Rhythm: Normal rate and regular rhythm.     Comments: Dorsalis pedis and posterior tibial pulses diminished bilaterally. Pulmonary:     Effort: Pulmonary effort is normal.     Breath sounds: Normal breath sounds.  Musculoskeletal:     Cervical back: Neck supple.  Neurological:     Mental Status: He is alert.  Psychiatric:        Mood and Affect: Mood normal.        Behavior: Behavior normal.     ------------------------------------------------------------------------------------------------------------------------------------------------------------------------------------------------------------------- Assessment and Plan  Intermittent claudication (HCC) Neurologic versus vascular claudication.  He does have diminished pulses and ABIs have been ordered.  We discussed that if ABIs are normal then we will proceed with MRI of the lumbar spine to evaluate for worsening spinal stenosis related to degenerative disc disease.  I will have him increase his Lyrica to 150 mg twice daily continue Norco as needed.   Meds ordered this encounter  Medications   HYDROcodone-acetaminophen (NORCO) 10-325 MG tablet    Sig: Take 1 tablet by mouth every 8 (eight) hours as needed.    Dispense:  60 tablet    Refill:  0    Call pt when ready   pregabalin (LYRICA) 150 MG capsule    Sig:  Take 1 capsule (150 mg total) by mouth 2 (two) times daily.    Dispense:  60 capsule    Refill:  3    No follow-ups on file.    This visit occurred during the SARS-CoV-2 public health emergency.  Safety protocols were in place, including screening questions prior to the visit, additional usage of staff PPE, and extensive cleaning of exam room while observing appropriate contact time as indicated for disinfecting solutions.

## 2022-12-27 ENCOUNTER — Ambulatory Visit (HOSPITAL_COMMUNITY)
Admission: RE | Admit: 2022-12-27 | Discharge: 2022-12-27 | Disposition: A | Discharge status: Home / Self Care | Payer: BC Managed Care – PPO | Source: Ambulatory Visit | Attending: Family Medicine | Admitting: Family Medicine

## 2022-12-27 DIAGNOSIS — I739 Peripheral vascular disease, unspecified: Secondary | ICD-10-CM | POA: Diagnosis not present

## 2022-12-27 LAB — VAS US ABI WITH/WO TBI
Left ABI: 0.7
Right ABI: 0.75

## 2023-01-03 ENCOUNTER — Telehealth: Payer: Self-pay

## 2023-01-03 ENCOUNTER — Other Ambulatory Visit: Payer: Self-pay | Admitting: Family Medicine

## 2023-01-03 DIAGNOSIS — I739 Peripheral vascular disease, unspecified: Secondary | ICD-10-CM

## 2023-01-03 NOTE — Telephone Encounter (Signed)
Copied from CRM [id number]. Topic: Clinical - Lab/Test Results >> Jan 03, 2023 12:01 PM Joanette Gula wrote: Pt. States he was returning Tiffany's call regarding test results. He asked to please call him

## 2023-01-04 ENCOUNTER — Ambulatory Visit: Payer: BC Managed Care – PPO | Admitting: Vascular Surgery

## 2023-01-04 ENCOUNTER — Encounter: Payer: Self-pay | Admitting: Vascular Surgery

## 2023-01-04 VITALS — BP 96/66 | HR 75 | Temp 97.8°F | Resp 20 | Ht 68.0 in | Wt 200.0 lb

## 2023-01-04 DIAGNOSIS — I70213 Atherosclerosis of native arteries of extremities with intermittent claudication, bilateral legs: Secondary | ICD-10-CM

## 2023-01-04 NOTE — Progress Notes (Signed)
 [facility] [place]  ASSESSMENT / PLAN: Vincent Wall is a 62 y.o. male with atherosclerosis of native arteries of bilateral lower extremities, left worse than right causing intermittent claudication.  Recommend:  Abstinence from all tobacco products. Blood glucose control with goal A1c < 7%. Blood pressure control with goal blood pressure < 140/90 mmHg. Lipid reduction therapy with goal LDL-C <100 mg/dL  Aspirin 81mg  PO QD.  Atorvastatin 40-80mg  PO QD (or other high intensity statin therapy). Daily walking to and past the point of discomfort.   Patient not interested in smoking cessation or walking program. He is not a candidate for intervention until he pursues nonoperative treatment. I will see him again in 3 months.   CHIEF COMPLAINT: Location  HISTORY OF PRESENT ILLNESS: Vincent Wall is a 62 y.o. male referred to clinic for evaluation of pain with walking.  The patient reports calf cramping with ambulation.  He reports this is exercise dependent.  He will not give me a very clear answer on how far he can go before pain starts.  He reports this does not interfere with his work.  He initially told me he was able to play golf, but later in the visit told me he was not able to because of his symptoms.  I counseled him about the importance of exercise therapy and smoking cessation.  He is rather flippant about potential compliance with these.  He does not describe rest pain symptoms.  He has no ulcers about his feet.   Past Medical History:  Diagnosis Date   Elevated uric acid in blood 06/16/2018   Heart attack (HCC)    Heart disease    Hyperlipidemia    Hypertension     Past Surgical History:  Procedure Laterality Date   CORONARY STENT PLACEMENT  05/2010   ELBOW SURGERY  2010  Coronary artery bypass grafting  Family History  Problem Relation Age of Onset   Breast cancer Unknown    Heart attack Unknown    Diabetes Unknown    Hyperlipidemia Unknown     Hypertension Unknown     Social History   Socioeconomic History   Marital status: Married    Spouse name: Not on file   Number of children: Not on file   Years of education: Not on file   Highest education level: Not on file  Occupational History   Not on file  Tobacco Use   Smoking status: Every Day    Current packs/day: 1.00    Average packs/day: 1 pack/day for 20.0 years (20.0 ttl pk-yrs)    Types: Cigarettes   Smokeless tobacco: Never  Substance and Sexual Activity   Alcohol use: Not Currently    Alcohol/week: 1.0 standard drink of alcohol    Types: 1 Standard drinks or equivalent per week   Drug use: No   Sexual activity: Yes    Partners: Female  Other Topics Concern   Not on file  Social History Narrative   Not on file   Social Drivers of Health   Financial Resource Strain: Low Risk  (09/16/2022)   Received from [facility]   Overall Financial Resource Strain (CARDIA)    Difficulty of Paying Living Expenses: Not hard at all  Food Insecurity: No Food Insecurity (09/16/2022)   Received from [facility]   Hunger Vital Sign    Worried About Running Out of Food in the Last Year: Never true    Ran Out of Food in the Last Year: Never  true  Transportation Needs: No Transportation Needs (09/16/2022)   Received from [facility]   PRAPARE - Transportation    Lack of Transportation (Medical): No    Lack of Transportation (Non-Medical): No  Physical Activity: Not on file  Stress: Stress Concern Present (09/17/2020)   Received from [facility], [facility] of Occupational Health - Occupational Stress Questionnaire    Feeling of Stress : To some extent  Social Connections: Unknown (05/04/2021)   Received from [facility], [facility]   Social Network    Social Network: Not on file  Intimate Partner Violence: Unknown (04/07/2021)   Received from [facility], [facility]   HITS    Physically Hurt: Not on file    Insult or Talk Down To:  Not on file    Threaten Physical Harm: Not on file    Scream or Curse: Not on file    Allergies  Allergen Reactions   Jardiance  [Empagliflozin ] Diarrhea   Citalopram Hydrobromide     GI   Crestor [Rosuvastatin]     Myalgia     Lipitor [Atorvastatin]     Myalgias   Metformin  And Related Diarrhea   Nabumetone     GI   Viagra  [Sildenafil Citrate]     GI upset    Current Outpatient Medications  Medication Sig Dispense Refill   allopurinol  (ZYLOPRIM ) 100 MG tablet Take 1 tablet (100 mg total) by mouth daily. 90 tablet 1   apixaban  (ELIQUIS ) 5 MG TABS tablet Take 1 tablet (5 mg total) by mouth 2 (two) times daily. Lot: [id number] Exp: 05/2023 180 tablet 0   colchicine  0.6 MG tablet START 2 TABS FOLLOWED BY 1 TAB 1 HOUR LATER. CONTINUE 1 TAB DAILY AS NEEDED FOR TOE PAIN 90 tablet 1   HYDROcodone -acetaminophen  (NORCO) 10-325 MG tablet Take 1 tablet by mouth every 8 (eight) hours as needed. 60 tablet 0   insulin glargine , 2 Unit Dial, (TOUJEO  MAX SOLOSTAR) 300 UNIT/ML Solostar Pen Inject 50 Units into the skin daily. 15 mL 1   Insulin Pen Needle (BD PEN NEEDLE NANO 2ND GEN) 32G X 4 MM MISC USE TO INJECT INSULIN ONCE DAILY 100 each 0   levocetirizine (XYZAL ) 5 MG tablet Take 1 tablet (5 mg total) by mouth daily. 90 tablet 1   lisinopril -hydrochlorothiazide  (ZESTORETIC ) 20-12.5 MG tablet Take 1 tablet by mouth daily. 90 tablet 1   metoprolol  succinate (TOPROL -XL) 50 MG 24 hr tablet Take 1 tablet (50 mg total) by mouth daily with breakfast. Take with or immediately following a meal. 90 tablet 1   nitroGLYCERIN  (NITROSTAT ) 0.4 MG SL tablet Place 1 tablet (0.4 mg total) under the tongue every 5 (five) minutes as needed for chest pain. 50 tablet 3   NITROGLYCERIN  TD Patient applies 1/4 patch on each foot as needed for plantar fasciitis.     omeprazole  (PRILOSEC) 20 MG capsule Take 1 capsule (20 mg total) by mouth daily. 90 capsule 1   pregabalin  (LYRICA ) 150 MG capsule Take 1 capsule (150 mg  total) by mouth 2 (two) times daily. 60 capsule 3   temazepam  (RESTORIL ) 30 MG capsule Take 1 capsule (30 mg total) by mouth at bedtime as needed for sleep. 90 capsule 1   varenicline  (CHANTIX ) 1 MG tablet Take 1 tablet (1 mg total) by mouth 2 (two) times daily. 60 tablet 2   No current facility-administered medications for this visit.    PHYSICAL EXAM Vitals:   01/04/23 0902  BP:  96/66  Pulse: 75  Resp: 20  Temp: 97.8 F (36.6 C)  SpO2: 97%  Weight: 200 lb (90.7 kg)  Height: 5' 8 (1.727 m)   Elderly man in no distress Regular rate and rhythm Unlabored breathing Feet are warm without ulcer No palpable pedal pulses   PERTINENT LABORATORY AND RADIOLOGIC DATA  Most recent CBC    Latest Ref Rng & Units 04/24/2020    9:01 AM 04/26/2019    9:55 AM 06/15/2018    8:58 AM  CBC  WBC 3.4 - 10.8 x10E3/uL 6.6  6.3  6.0   Hemoglobin 13.0 - 17.7 g/dL 85.7  85.4  85.3   Hematocrit 37.5 - 51.0 % 43.9  44.2  44.1   Platelets 150 - 450 x10E3/uL  190  171      Most recent CMP    Latest Ref Rng & Units 11/04/2020   12:08 PM 04/24/2020    9:01 AM 04/26/2019    9:55 AM  CMP  Glucose 70 - 99 mg/dL 811  828  735   BUN 8 - 27 mg/dL 14  12  14    Creatinine 0.76 - 1.27 mg/dL 8.98  8.96  8.90   Sodium 134 - 144 mmol/L 137  139  138   Potassium 3.5 - 5.2 mmol/L 4.6  4.3  4.3   Chloride 96 - 106 mmol/L 102  98  100   CO2 20 - 29 mmol/L 24  23  26    Calcium  8.6 - 10.2 mg/dL 9.5  9.5  9.3   Total Protein 6.0 - 8.5 g/dL  7.0  6.6   Total Bilirubin 0.0 - 1.2 mg/dL  0.7  0.8   Alkaline Phos 44 - 121 IU/L  96  97   AST 0 - 40 IU/L  19  31   ALT 0 - 44 IU/L  38  42     Renal function CrCl cannot be calculated (Patient's most recent lab result is older than the maximum 21 days allowed.).  Hemoglobin A1C  Date Value  11/12/2022 10.9 % (A)  09/18/2020 7.8   HbA1c, POC (controlled diabetic range) (%)  Date Value  11/12/2022 10.9 (A)    LDL Cholesterol (Calc)  Date Value Ref Range Status   06/15/2018 117 (H) mg/dL (calc) Final    Comment:    Reference range: <100 . Desirable range <100 mg/dL for primary prevention;   <70 mg/dL for patients with CHD or diabetic patients  with > or = 2 CHD risk factors. SABRA LDL-C is now calculated using the Martin-Hopkins  calculation, which is a validated novel method providing  better accuracy than the Friedewald equation in the  estimation of LDL-C.  Gladis APPLETHWAITE et al. SANDREA. 7986;689(80): 2061-2068  (http://education.[employer].com/faq/FAQ164)    LDL Chol Calc (NIH)  Date Value Ref Range Status  04/24/2020 148 (H) 0 - 99 mg/dL Final     +-------+-----------+-----------+------------+------------+  ABI/TBIToday's ABIToday's TBIPrevious ABIPrevious TBI  +-------+-----------+-----------+------------+------------+  Right 0.75       0.48                                 +-------+-----------+-----------+------------+------------+  Left  0.70       0.55                                 +-------+-----------+-----------+------------+------------+   Debby SAILOR. Magda,  MD FACS [facility] Phone Number: [phone] 01/04/2023 9:12 AM   Total time spent on preparing this encounter including chart review, data review, collecting history, examining the patient, coordinating care for this new patient, 60 minutes.  Portions of this report may have been transcribed using voice recognition software.  Every effort has been made to ensure accuracy; however, inadvertent computerized transcription errors may still be present.

## 2023-01-18 ENCOUNTER — Other Ambulatory Visit: Payer: Self-pay

## 2023-01-18 DIAGNOSIS — I70213 Atherosclerosis of native arteries of extremities with intermittent claudication, bilateral legs: Secondary | ICD-10-CM

## 2023-02-07 ENCOUNTER — Telehealth: Payer: Self-pay

## 2023-02-07 NOTE — Telephone Encounter (Signed)
Copied from CRM [id number]. Topic: Clinical - Prescription Issue >> Feb 07, 2023 10:15 AM Nila Nephew wrote: Reason for CRM: Insurance is calling in to state that insulin glargine, 2 Unit Dial, (TOUJEO MAX SOLOSTAR) 300 UNIT/ML Solostar Pen is not a covered prescriptions and that alternatives are [place] and [place]. Insurance inquiring if either of these are reasonable alternative prescriptions for this patient. C/b at [phone] - anyone can assist - voicemail is not an option.   Rx needs to go to [facility] in [place] (P: [phone]) on [address] for it to be covered. Recommended to verify with patient.

## 2023-02-08 ENCOUNTER — Ambulatory Visit: Payer: Self-pay | Admitting: Family Medicine

## 2023-02-08 MED ORDER — TRESIBA FLEXTOUCH 200 UNIT/ML ~~LOC~~ SOPN: 50.0000 [IU] | PEN_INJECTOR | Freq: Every day | SUBCUTANEOUS | 1 refills | Status: DC

## 2023-02-08 NOTE — Telephone Encounter (Addendum)
 This RN tried to contact CAL during call to assist patient, call disconnected from patient. Patient states he spoke to clinic yesterday and was supposed to have insulin called in but it is not at the pharmacy. This RN attempted to call back twice to complete triage, patient did not answer, voicemail left.   Copied from CRM [id number]. Topic: Clinical - Red Word Triage >> Feb 08, 2023  3:37 PM Powell HERO wrote: Red Word that prompted transfer to Nurse Triage: Blood sugar over 400, cannot get any insulin. Answer Assessment - Initial Assessment Questions 1. BLOOD GLUCOSE: What is your blood glucose level?      400 last night- has not checked today. 2. ONSET: When did you check the blood glucose?     Last night 3. USUAL RANGE: What is your glucose level usually? (e.g., usual fasting morning value, usual evening value)       4. KETONES: Do you check for ketones (urine or blood test strips)? If Yes, ask: What does the test show now?      Unable to check 5. TYPE 1 or 2:  Do you know what type of diabetes you have?  (e.g., Type 1, Type 2, Gestational; doesn't know)        6. INSULIN: Do you take insulin? What type of insulin(s) do you use? What is the mode of delivery? (syringe, pen; injection or pump)?      Supposed to take insulin, but has been out since first of the year. 7. DIABETES PILLS: Do you take any pills for your diabetes? If Yes, ask: Have you missed taking any pills recently?       8. OTHER SYMPTOMS: Do you have any symptoms? (e.g., fever, frequent urination, difficulty breathing, dizziness, weakness, vomiting)     Increased urination  Protocols used: Diabetes - High Blood Sugar-A-AH

## 2023-02-08 NOTE — Telephone Encounter (Signed)
Attempted to get in touch with patient to finish triage. Had to leave a voicemail to call back. Will place back in callback folder

## 2023-02-08 NOTE — Telephone Encounter (Signed)
This RN attempted to contact pt, 3rd attempt. LMOM advising pt insulin script was sent to pharmacy today, advised to call back with additional questions or concerns. Routing to office

## 2023-02-15 NOTE — Telephone Encounter (Signed)
Attempted call to patient. Let a voice mail message requesting a return call.

## 2023-02-18 NOTE — Telephone Encounter (Signed)
Spoke with patient insurance company and informed of medication change.

## 2023-03-02 ENCOUNTER — Telehealth: Payer: Self-pay

## 2023-03-02 NOTE — Telephone Encounter (Signed)
 Changed [facility]acy [facility] at [address]

## 2023-03-02 NOTE — Telephone Encounter (Signed)
 Copied from CRM [id number]. Topic: Clinical - Prescription Issue >> Feb 28, 2023 10:23 AM Nila Nephew wrote: Reason for CRM: Patient would like all future prescriptions to go to the [facility] on [address] and [address] in [place], as that is what his insurance will cover.   Informed patient of Kim's most recent advice regarding insulin as well.

## 2023-03-04 ENCOUNTER — Other Ambulatory Visit: Payer: Self-pay | Admitting: Family Medicine

## 2023-03-16 ENCOUNTER — Ambulatory Visit: Payer: BC Managed Care – PPO | Admitting: Family Medicine

## 2023-03-17 ENCOUNTER — Ambulatory Visit: Payer: BC Managed Care – PPO | Admitting: Family Medicine

## 2023-03-22 ENCOUNTER — Ambulatory Visit (INDEPENDENT_AMBULATORY_CARE_PROVIDER_SITE_OTHER): Payer: BC Managed Care – PPO | Admitting: Family Medicine

## 2023-03-22 VITALS — BP 114/72 | HR 79 | Ht 68.0 in | Wt 195.0 lb

## 2023-03-22 DIAGNOSIS — I10 Essential (primary) hypertension: Secondary | ICD-10-CM | POA: Diagnosis not present

## 2023-03-22 DIAGNOSIS — R29818 Other symptoms and signs involving the nervous system: Secondary | ICD-10-CM

## 2023-03-22 DIAGNOSIS — E790 Hyperuricemia without signs of inflammatory arthritis and tophaceous disease: Secondary | ICD-10-CM

## 2023-03-22 DIAGNOSIS — I739 Peripheral vascular disease, unspecified: Secondary | ICD-10-CM

## 2023-03-22 DIAGNOSIS — M5030 Other cervical disc degeneration, unspecified cervical region: Secondary | ICD-10-CM

## 2023-03-22 DIAGNOSIS — M51362 Other intervertebral disc degeneration, lumbar region with discogenic back pain and lower extremity pain: Secondary | ICD-10-CM

## 2023-03-22 DIAGNOSIS — E1140 Type 2 diabetes mellitus with diabetic neuropathy, unspecified: Secondary | ICD-10-CM | POA: Diagnosis not present

## 2023-03-22 DIAGNOSIS — I2510 Atherosclerotic heart disease of native coronary artery without angina pectoris: Secondary | ICD-10-CM

## 2023-03-22 LAB — POCT GLYCOSYLATED HEMOGLOBIN (HGB A1C): HbA1c, POC (prediabetic range): 10.7 % — AB (ref 5.7–6.4)

## 2023-03-22 MED ORDER — OMEPRAZOLE 20 MG PO CPDR
20.0000 mg | DELAYED_RELEASE_CAPSULE | Freq: Every day | ORAL | 1 refills | Status: DC
Start: 2023-03-22 — End: 2023-06-28

## 2023-03-22 MED ORDER — METOPROLOL SUCCINATE ER 50 MG PO TB24: 50.0000 mg | ORAL_TABLET | Freq: Every day | ORAL | 1 refills | Status: DC

## 2023-03-22 MED ORDER — TEMAZEPAM 30 MG PO CAPS: 30.0000 mg | ORAL_CAPSULE | Freq: Every evening | ORAL | 1 refills | Status: DC | PRN

## 2023-03-22 MED ORDER — ALLOPURINOL 100 MG PO TABS: 100.0000 mg | ORAL_TABLET | Freq: Every day | ORAL | 1 refills | Status: DC

## 2023-03-22 MED ORDER — PREGABALIN 150 MG PO CAPS
150.0000 mg | ORAL_CAPSULE | Freq: Two times a day (BID) | ORAL | 3 refills | Status: DC
Start: 2023-03-22 — End: 2023-06-28

## 2023-03-22 MED ORDER — HYDROCODONE-ACETAMINOPHEN 10-325 MG PO TABS: 1.0000 | ORAL_TABLET | Freq: Three times a day (TID) | ORAL | 0 refills | Status: DC | PRN

## 2023-03-22 MED ORDER — LISINOPRIL-HYDROCHLOROTHIAZIDE 20-12.5 MG PO TABS: 1.0000 | ORAL_TABLET | Freq: Every day | ORAL | 1 refills | Status: DC

## 2023-03-22 NOTE — Progress Notes (Signed)
 Vincent Wall - 63 y.o. male MRN [id number]  Date of birth: 1960/10/28  Subjective Chief Complaint  Patient presents with   Medical Management of Chronic Issues    HPI Vincent Wall is a  63 y.o. male here today for follow up visit.    Seen by vascular surgeon and reports he is little frustrated.  He continues to have claudication symptoms.  Was told to walk more.  Reports that he walks about a mile and a half each day but pain becomes unbearable when walking long distances.  He is already on eliquis.  ABIs did show moderate PAD.  He also has chronic low back pain he does report that his symptoms do improve when leaning forward at times.  MRI in 2017 with left foraminal disc protrusion with mass effect of the left L3 nerve root which is the side that bothers him most.  Currently using combination of Norco and Lyrica.  His A1c remains elevated at 10.7%.  He reports he has been out of [place] for about 3 to 4 weeks.  He did recently pick this up.  He has not made any efforts to change his diet.  He has been intolerant to statins.  History of coronary artery disease and is unsure about adding Repatha.  ROS:  A comprehensive ROS was completed and negative except as noted per HPI    Allergies  Allergen Reactions   Jardiance [Empagliflozin] Diarrhea   Citalopram Hydrobromide     GI   Crestor [Rosuvastatin]     Myalgia     Lipitor [Atorvastatin]     Myalgias   Metformin And Related Diarrhea   Nabumetone     GI   Viagra  [Sildenafil Citrate]     GI upset    Past Medical History:  Diagnosis Date   Elevated uric acid in blood 06/16/2018   Heart attack (HCC)    Heart disease    Hyperlipidemia    Hypertension     Past Surgical History:  Procedure Laterality Date   CORONARY STENT PLACEMENT  05/2010   ELBOW SURGERY  2010    Social History   Socioeconomic History   Marital status: Married    Spouse name: Not on file   Number of children: Not on file   Years of education: Not on  file   Highest education level: Not on file  Occupational History   Not on file  Tobacco Use   Smoking status: Every Day    Current packs/day: 1.00    Average packs/day: 1 pack/day for 20.0 years (20.0 ttl pk-yrs)    Types: Cigarettes   Smokeless tobacco: Never  Substance and Sexual Activity   Alcohol use: Not Currently    Alcohol/week: 1.0 standard drink of alcohol    Types: 1 Standard drinks or equivalent per week   Drug use: No   Sexual activity: Yes    Partners: Female  Other Topics Concern   Not on file  Social History Narrative   Not on file   Social Drivers of Health   Financial Resource Strain: Low Risk  (09/16/2022)   Received from [facility]   Overall Financial Resource Strain (CARDIA)    Difficulty of Paying Living Expenses: Not hard at all  Food Insecurity: No Food Insecurity (09/16/2022)   Received from [facility]   Hunger Vital Sign    Worried About Running Out of Food in the Last Year: Never true    Ran Out of Food in the Last  Year: Never true  Transportation Needs: No Transportation Needs (09/16/2022)   Received from [facility] - Transportation    Lack of Transportation (Medical): No    Lack of Transportation (Non-Medical): No  Physical Activity: Not on file  Stress: Stress Concern Present (09/17/2020)   Received from [facility], [facility] of Occupational Health - Occupational Stress Questionnaire    Feeling of Stress : To some extent  Social Connections: Unknown (05/04/2021)   Received from [facility], [facility]   Social Network    Social Network: Not on file    Family History  Problem Relation Age of Onset   Breast cancer Unknown    Heart attack Unknown    Diabetes Unknown    Hyperlipidemia Unknown    Hypertension Unknown     Health Maintenance  Topic Date Due   Colonoscopy  Never done   Lung Cancer Screening  Never done   Pneumonia Vaccine 22+ Years old (2 of 2 - PCV) 10/27/2011   Diabetic  kidney evaluation - eGFR measurement  11/04/2021   OPHTHALMOLOGY EXAM  09/03/2022   COVID-19 Vaccine (3 - 2024-25 season) 09/05/2022   Diabetic kidney evaluation - Urine ACR  03/09/2023   INFLUENZA VACCINE  04/04/2023 (Originally 08/05/2022)   Zoster Vaccines- Shingrix (1 of 2) 06/09/2023 (Originally 01/06/2008)   HEMOGLOBIN A1C  09/22/2023   FOOT EXAM  03/21/2024   DTaP/Tdap/Td (3 - Td or Tdap) 04/25/2030   Hepatitis C Screening  Completed   HPV VACCINES  Aged Out   HIV Screening  Discontinued     ----------------------------------------------------------------------------------------------------------------------------------------------------------------------------------------------------------------- Physical Exam BP 114/72 (BP Location: Left Arm, Patient Position: Sitting, Cuff Size: Normal)   Pulse 79   Ht 5\' 8"  (1.727 m)   Wt 195 lb (88.5 kg)   SpO2 98%   BMI 29.65 kg/m   Physical Exam Constitutional:      Appearance: Normal appearance.  HENT:     Head: Normocephalic and atraumatic.  Eyes:     General: No scleral icterus. Cardiovascular:     Rate and Rhythm: Normal rate and regular rhythm.  Pulmonary:     Effort: Pulmonary effort is normal.     Breath sounds: Normal breath sounds.  Neurological:     Mental Status: He is alert.  Psychiatric:        Mood and Affect: Mood normal.        Behavior: Behavior normal.     ------------------------------------------------------------------------------------------------------------------------------------------------------------------------------------------------------------------- Assessment and Plan  Essential hypertension, benign Blood pressure remains well-controlled.  Recommend continuation of current medication.  Type 2 diabetes mellitus with diabetic neuropathy, unspecified (HCC) Diabetes remains poorly controlled.  He was out of insulin for a few weeks and just picked this up at this point.  From encouraged to  work on dietary changes.  CAD (coronary artery disease) We had discussion today regarding his continued risk of heart attack and stroke.  His cardiologist has recommended repatha for management of lipids due to his refusal of statins.  He still remains has not had Repatha.  He understands risks of continued untreated cholesterol in addition to his poorly controlled diabetes.    Intermittent claudication (HCC) He does have PAD but I think he may have some neurogenic claudication related to his chronic back pain.  I have ordered an updated MRI.   Meds ordered this encounter  Medications   allopurinol (ZYLOPRIM) 100 MG tablet    Sig: Take 1 tablet (100 mg total) by mouth daily.  Dispense:  90 tablet    Refill:  1   HYDROcodone-acetaminophen (NORCO) 10-325 MG tablet    Sig: Take 1 tablet by mouth every 8 (eight) hours as needed.    Dispense:  60 tablet    Refill:  0    Call pt when ready   lisinopril-hydrochlorothiazide (ZESTORETIC) 20-12.5 MG tablet    Sig: Take 1 tablet by mouth daily.    Dispense:  90 tablet    Refill:  1   metoprolol succinate (TOPROL-XL) 50 MG 24 hr tablet    Sig: Take 1 tablet (50 mg total) by mouth daily with breakfast. Take with or immediately following a meal.    Dispense:  90 tablet    Refill:  1   omeprazole (PRILOSEC) 20 MG capsule    Sig: Take 1 capsule (20 mg total) by mouth daily.    Dispense:  90 capsule    Refill:  1   pregabalin (LYRICA) 150 MG capsule    Sig: Take 1 capsule (150 mg total) by mouth 2 (two) times daily.    Dispense:  60 capsule    Refill:  3   temazepam (RESTORIL) 30 MG capsule    Sig: Take 1 capsule (30 mg total) by mouth at bedtime as needed for sleep.    Dispense:  90 capsule    Refill:  1    Call pt when ready    Return in about 3 months (around 06/22/2023) for Type 2 Diabetes, Hypertension.    This visit occurred during the SARS-CoV-2 public health emergency.  Safety protocols were in place, including screening  questions prior to the visit, additional usage of staff PPE, and extensive cleaning of exam room while observing appropriate contact time as indicated for disinfecting solutions.

## 2023-03-27 ENCOUNTER — Encounter: Payer: Self-pay | Admitting: Family Medicine

## 2023-03-27 DIAGNOSIS — R29818 Other symptoms and signs involving the nervous system: Secondary | ICD-10-CM | POA: Insufficient documentation

## 2023-03-27 NOTE — Assessment & Plan Note (Signed)
 We had discussion today regarding his continued risk of heart attack and stroke.  His cardiologist has recommended repatha for management of lipids due to his refusal of statins.  He still remains has not had Repatha.  He understands risks of continued untreated cholesterol in addition to his poorly controlled diabetes.

## 2023-03-27 NOTE — Assessment & Plan Note (Signed)
 Blood pressure remains well-controlled.  Recommend continuation of current medication.

## 2023-03-27 NOTE — Assessment & Plan Note (Signed)
 He does have PAD but I think he may have some neurogenic claudication related to his chronic back pain.  I have ordered an updated MRI.

## 2023-03-27 NOTE — Assessment & Plan Note (Signed)
 Diabetes remains poorly controlled.  He was out of insulin for a few weeks and just picked this up at this point.  From encouraged to work on dietary changes.

## 2023-03-29 ENCOUNTER — Other Ambulatory Visit (HOSPITAL_COMMUNITY): Payer: Self-pay

## 2023-03-29 ENCOUNTER — Telehealth: Payer: Self-pay

## 2023-03-29 NOTE — Telephone Encounter (Signed)
 Pharmacy Patient Advocate Encounter   Received notification from Onbase that prior authorization for HYDROcodone-Acetaminophen 10-325MG  tablets is required/requested.   Insurance verification completed.   The patient is insured through [employer] .   Per test claim: PA required; PA submitted to above mentioned insurance via CoverMyMeds Key/confirmation #/EOC [facility] Status is pending

## 2023-03-30 ENCOUNTER — Other Ambulatory Visit (HOSPITAL_COMMUNITY): Payer: Self-pay

## 2023-03-30 NOTE — Telephone Encounter (Signed)
 Pharmacy Patient Advocate Encounter  Received notification from [employer] that Prior Authorization for HYDROcodone-Acetaminophen 10-325MG  tablets has been APPROVED from 03/29/23 to 03/28/24. Ran test claim, Copay is $8.39. This test claim was processed through [facility]- copay amounts may vary at other pharmacies due to pharmacy/plan contracts, or as the patient moves through the different stages of their insurance plan.   PA #/Case ID/Reference #: [id number]  *LVM for [facility] to process

## 2023-04-21 ENCOUNTER — Other Ambulatory Visit: Payer: Self-pay

## 2023-04-21 MED ORDER — TRESIBA FLEXTOUCH 200 UNIT/ML ~~LOC~~ SOPN: 50.0000 [IU] | PEN_INJECTOR | Freq: Every day | SUBCUTANEOUS | 1 refills | Status: DC

## 2023-05-03 ENCOUNTER — Ambulatory Visit: Payer: BC Managed Care – PPO | Admitting: Vascular Surgery

## 2023-05-03 ENCOUNTER — Ambulatory Visit (HOSPITAL_COMMUNITY): Payer: BC Managed Care – PPO

## 2023-05-24 ENCOUNTER — Other Ambulatory Visit: Payer: Self-pay | Admitting: Family Medicine

## 2023-05-24 ENCOUNTER — Other Ambulatory Visit: Payer: Self-pay

## 2023-05-24 MED ORDER — APIXABAN 5 MG PO TABS: 5.0000 mg | ORAL_TABLET | Freq: Two times a day (BID) | ORAL | 0 refills | Status: DC

## 2023-05-24 NOTE — Telephone Encounter (Signed)
 Patient called out of Eliquis   Has upcoming appt schld for June 24th  Sent one month supply per protocol until pt can be seen in office.

## 2023-05-24 NOTE — Telephone Encounter (Signed)
 Copied from CRM [id number]. Topic: Clinical - Medication Refill >> May 24, 2023 10:12 AM Danelle Dunning F wrote: Patient is calling in stating that he was unable to refill his prescription through the pharmacy due to not receiving the prescription directly from the office. The patient has one pill left and his appropriate prescription instructions states he is suppose to take two a day.  Medication: apixaban  (ELIQUIS ) 5 MG TABS tablet  Has the patient contacted their pharmacy? Yes   This is the patient's preferred pharmacy:  [facility] #[id number] - [place], [place] - [address] [place] [address] Phone: [phone] Fax: [phone]  Is this the correct pharmacy for this prescription? Yes   Has the prescription been filled recently? No  Is the patient out of the medication? Yes  Has the patient been seen for an appointment in the last year OR does the patient have an upcoming appointment? Yes  Can we respond through MyChart? No, Patient would like to be called when prescription is placed. [phone]  Agent: Please be advised that Rx refills may take up to 3 business days. We ask that you follow-up with your pharmacy.

## 2023-06-21 ENCOUNTER — Other Ambulatory Visit: Payer: Self-pay | Admitting: Family Medicine

## 2023-06-21 NOTE — Telephone Encounter (Signed)
 Should patient continue Eliquis?

## 2023-06-28 ENCOUNTER — Encounter: Payer: Self-pay | Admitting: Family Medicine

## 2023-06-28 ENCOUNTER — Ambulatory Visit (INDEPENDENT_AMBULATORY_CARE_PROVIDER_SITE_OTHER): Admitting: Family Medicine

## 2023-06-28 VITALS — BP 112/72 | HR 77 | Ht 68.0 in | Wt 187.0 lb

## 2023-06-28 DIAGNOSIS — G6289 Other specified polyneuropathies: Secondary | ICD-10-CM

## 2023-06-28 DIAGNOSIS — E1140 Type 2 diabetes mellitus with diabetic neuropathy, unspecified: Secondary | ICD-10-CM

## 2023-06-28 DIAGNOSIS — I2510 Atherosclerotic heart disease of native coronary artery without angina pectoris: Secondary | ICD-10-CM | POA: Diagnosis not present

## 2023-06-28 DIAGNOSIS — R29818 Other symptoms and signs involving the nervous system: Secondary | ICD-10-CM

## 2023-06-28 DIAGNOSIS — I10 Essential (primary) hypertension: Secondary | ICD-10-CM

## 2023-06-28 DIAGNOSIS — M5416 Radiculopathy, lumbar region: Secondary | ICD-10-CM

## 2023-06-28 DIAGNOSIS — Z23 Encounter for immunization: Secondary | ICD-10-CM

## 2023-06-28 DIAGNOSIS — E782 Mixed hyperlipidemia: Secondary | ICD-10-CM

## 2023-06-28 DIAGNOSIS — G720 Drug-induced myopathy: Secondary | ICD-10-CM | POA: Insufficient documentation

## 2023-06-28 DIAGNOSIS — G894 Chronic pain syndrome: Secondary | ICD-10-CM

## 2023-06-28 DIAGNOSIS — F5101 Primary insomnia: Secondary | ICD-10-CM

## 2023-06-28 DIAGNOSIS — T466X5A Adverse effect of antihyperlipidemic and antiarteriosclerotic drugs, initial encounter: Secondary | ICD-10-CM

## 2023-06-28 DIAGNOSIS — Z125 Encounter for screening for malignant neoplasm of prostate: Secondary | ICD-10-CM

## 2023-06-28 DIAGNOSIS — E790 Hyperuricemia without signs of inflammatory arthritis and tophaceous disease: Secondary | ICD-10-CM

## 2023-06-28 DIAGNOSIS — I480 Paroxysmal atrial fibrillation: Secondary | ICD-10-CM

## 2023-06-28 LAB — POCT GLYCOSYLATED HEMOGLOBIN (HGB A1C): HbA1c, POC (controlled diabetic range): 8 % — AB (ref 0.0–7.0)

## 2023-06-28 MED ORDER — COLCHICINE 0.6 MG PO TABS
ORAL_TABLET | ORAL | 1 refills | Status: DC
Start: 2023-06-28 — End: 2023-11-07

## 2023-06-28 MED ORDER — TRESIBA FLEXTOUCH 200 UNIT/ML ~~LOC~~ SOPN: 50.0000 [IU] | PEN_INJECTOR | Freq: Every day | SUBCUTANEOUS | 1 refills | Status: DC

## 2023-06-28 MED ORDER — APIXABAN 5 MG PO TABS: 5.0000 mg | ORAL_TABLET | Freq: Two times a day (BID) | ORAL | 1 refills | Status: DC

## 2023-06-28 MED ORDER — LISINOPRIL-HYDROCHLOROTHIAZIDE 20-12.5 MG PO TABS: 1.0000 | ORAL_TABLET | Freq: Every day | ORAL | 3 refills | Status: DC

## 2023-06-28 MED ORDER — OMEPRAZOLE 20 MG PO CPDR: 20.0000 mg | DELAYED_RELEASE_CAPSULE | Freq: Every day | ORAL | 3 refills | Status: DC

## 2023-06-28 MED ORDER — HYDROCODONE-ACETAMINOPHEN 10-325 MG PO TABS: 1.0000 | ORAL_TABLET | Freq: Three times a day (TID) | ORAL | 0 refills | Status: DC | PRN

## 2023-06-28 MED ORDER — GABAPENTIN 600 MG PO TABS: 600.0000 mg | ORAL_TABLET | Freq: Three times a day (TID) | ORAL | 1 refills | Status: DC

## 2023-06-28 MED ORDER — TEMAZEPAM 30 MG PO CAPS: 30.0000 mg | ORAL_CAPSULE | Freq: Every evening | ORAL | 1 refills | Status: DC | PRN

## 2023-06-28 MED ORDER — PREGABALIN 150 MG PO CAPS: 150.0000 mg | ORAL_CAPSULE | Freq: Two times a day (BID) | ORAL | 1 refills | Status: DC

## 2023-06-28 MED ORDER — LEVOCETIRIZINE DIHYDROCHLORIDE 5 MG PO TABS: 5.0000 mg | ORAL_TABLET | Freq: Every day | ORAL | 3 refills | Status: DC

## 2023-06-28 MED ORDER — ALLOPURINOL 100 MG PO TABS
100.0000 mg | ORAL_TABLET | Freq: Every day | ORAL | 3 refills | Status: DC
Start: 2023-06-28 — End: 2023-11-07

## 2023-06-28 MED ORDER — REPATHA SURECLICK 140 MG/ML ~~LOC~~ SOAJ: 140.0000 mg | SUBCUTANEOUS | 2 refills | Status: DC

## 2023-06-28 NOTE — Assessment & Plan Note (Signed)
 Intolerant to all statins tried.

## 2023-06-28 NOTE — Assessment & Plan Note (Signed)
 Temazepam  remains effective for him.  Will continue.

## 2023-06-28 NOTE — Assessment & Plan Note (Signed)
 Blood pressure remains well-controlled.  Recommend continuation of current medication.

## 2023-06-28 NOTE — Assessment & Plan Note (Signed)
 Continues to have lumbar radiculopathy with neurogenic claudications.  He has completed >6 weeks of conservative, physician directed therapy including medications and home exercise program without improvement.  MRI ordered.

## 2023-06-28 NOTE — Assessment & Plan Note (Signed)
 He has been intolerant to all statins.  Repatha sent by cardiology but he never got this.  Will send again.

## 2023-06-28 NOTE — Assessment & Plan Note (Signed)
Seen by cardiology.  Stable with metoprolol and anticoagulated with Eliquis.

## 2023-06-28 NOTE — Assessment & Plan Note (Signed)
 Better controlled but not optimal.  Encouraged increased dietary changes.

## 2023-06-28 NOTE — Progress Notes (Signed)
 Vincent Wall - 63 y.o. male MRN [id number]  Date of birth: 09-02-1960  Subjective Chief Complaint  Patient presents with   Diabetes   Hypertension   Hyperlipidemia    HPI Vincent Wall is a 63 y.o. male here today for follow up visit.   Diabetes mangement with Tresiba  50 units daily.  He was unable to tolerate GLP-1s due to nausea.   A1c today is 8.0%.   He is seeing cardiology for history of CAD and PAF.  BP remains well controlled with metoprolol  and lisinopri/hydrochlorothiazide .  He is anticoagulated with eliquis .  No side effects at this time.  He has not had new or worsening chest pain, exertional dyspnea, headache or vision changes.   He does have chronic low back pain. MRI ordered but denied due to not doing PT.  Declined PT previously.  Never had labs completed to evaluate for other causes of neuropathy after last visit.  He is prescribed norco as needed and lyrica .  He has been doing handout for home exercises over the past 3 months without improvement.   ROS:  A comprehensive ROS was completed and negative except as noted per HPI  Allergies  Allergen Reactions   Jardiance  [Empagliflozin ] Diarrhea   Citalopram Hydrobromide     GI   Crestor [Rosuvastatin]     Myalgia     Lipitor [Atorvastatin]     Myalgias   Metformin  And Related Diarrhea   Nabumetone     GI   Viagra  [Sildenafil Citrate]     GI upset    Past Medical History:  Diagnosis Date   Elevated uric acid in blood 06/16/2018   Heart attack (HCC)    Heart disease    Hyperlipidemia    Hypertension     Past Surgical History:  Procedure Laterality Date   CORONARY STENT PLACEMENT  05/2010   ELBOW SURGERY  2010    Social History   Socioeconomic History   Marital status: Married    Spouse name: Not on file   Number of children: Not on file   Years of education: Not on file   Highest education level: Not on file  Occupational History   Not on file  Tobacco Use   Smoking status: Every Day    Current  packs/day: 1.00    Average packs/day: 1 pack/day for 20.0 years (20.0 ttl pk-yrs)    Types: Cigarettes   Smokeless tobacco: Never  Substance and Sexual Activity   Alcohol use: Not Currently    Alcohol/week: 1.0 standard drink of alcohol    Types: 1 Standard drinks or equivalent per week   Drug use: No   Sexual activity: Yes    Partners: Female  Other Topics Concern   Not on file  Social History Narrative   Not on file   Social Drivers of Health   Financial Resource Strain: Low Risk  (09/16/2022)   Received from [facility]   Overall Financial Resource Strain (CARDIA)    Difficulty of Paying Living Expenses: Not hard at all  Food Insecurity: No Food Insecurity (09/16/2022)   Received from [facility]   Hunger Vital Sign    Within the past 12 months, you worried that your food would run out before you got the money to buy more.: Never true    Within the past 12 months, the food you bought just didn't last and you didn't have money to get more.: Never true  Transportation Needs: No Transportation Needs (09/16/2022)   Received  from [facility]   PRAPARE - Transportation    Lack of Transportation (Medical): No    Lack of Transportation (Non-Medical): No  Physical Activity: Not on file  Stress: Stress Concern Present (09/17/2020)   Received from [facility] of Occupational Health - Occupational Stress Questionnaire    Feeling of Stress : To some extent  Social Connections: Unknown (05/04/2021)   Received from [facility]   Social Network    Social Network: Not on file    Family History  Problem Relation Age of Onset   Breast cancer Unknown    Heart attack Unknown    Diabetes Unknown    Hyperlipidemia Unknown    Hypertension Unknown     Health Maintenance  Topic Date Due   Colonoscopy  Never done   Lung Cancer Screening  Never done   Zoster Vaccines- Shingrix (1 of 2) Never done   Pneumococcal Vaccine: 50+ Years (2 of 2 - PCV) 10/27/2011    OPHTHALMOLOGY EXAM  09/03/2022   Diabetic kidney evaluation - Urine ACR  03/09/2023   COVID-19 Vaccine (3 - 2024-25 season) 07/14/2023 (Originally 09/05/2022)   Diabetic kidney evaluation - eGFR measurement  09/28/2023 (Originally 11/04/2021)   INFLUENZA VACCINE  08/05/2023   HEMOGLOBIN A1C  12/28/2023   FOOT EXAM  03/21/2024   DTaP/Tdap/Td (3 - Td or Tdap) 04/25/2030   Hepatitis C Screening  Completed   Hepatitis B Vaccines  Aged Out   HPV VACCINES  Aged Out   Meningococcal B Vaccine  Aged Out   HIV Screening  Discontinued     ----------------------------------------------------------------------------------------------------------------------------------------------------------------------------------------------------------------- Physical Exam BP 112/72 (BP Location: Left Arm, Patient Position: Sitting, Cuff Size: Normal)   Pulse 77   Ht 5' 8 (1.727 m)   Wt 187 lb (84.8 kg)   SpO2 97%   BMI 28.43 kg/m   Physical Exam Constitutional:      Appearance: Normal appearance.   Eyes:     General: No scleral icterus.   Cardiovascular:     Rate and Rhythm: Normal rate and regular rhythm.  Pulmonary:     Effort: Pulmonary effort is normal.     Breath sounds: Normal breath sounds.   Musculoskeletal:     Cervical back: Neck supple.   Neurological:     Mental Status: He is alert.   Psychiatric:        Mood and Affect: Mood normal.        Behavior: Behavior normal.     ------------------------------------------------------------------------------------------------------------------------------------------------------------------------------------------------------------------- Assessment and Plan  Essential hypertension, benign Blood pressure remains well-controlled.  Recommend continuation of current medication.  Hyperlipidemia He has been intolerant to all statins.  Repatha sent by cardiology but he never got this.  Will send again.    Insomnia Temazepam  remains  effective for him.  Will continue.   Neurogenic claudication Continues to have lumbar radiculopathy with neurogenic claudications.  He has completed >6 weeks of conservative, physician directed therapy including medications and home exercise program without improvement.  MRI ordered.    PAF (paroxysmal atrial fibrillation) (HCC) Seen by cardiology.  Stable with metoprolol  and anticoagulated with Eliquis .   Type 2 diabetes mellitus with diabetic neuropathy, unspecified (HCC) Better controlled but not optimal.  Encouraged increased dietary changes.    Statin myopathy [G72.0, T46.6X5A] Intolerant to all statins tried.    Meds ordered this encounter  Medications   allopurinol  (ZYLOPRIM ) 100 MG tablet    Sig: Take 1 tablet (100 mg total) by mouth daily.  Dispense:  90 tablet    Refill:  3   colchicine  0.6 MG tablet    Sig: START 2 TABS FOLLOWED BY 1 TAB 1 HOUR LATER. CONTINUE 1 TAB DAILY AS NEEDED FOR TOE PAIN    Dispense:  90 tablet    Refill:  1   levocetirizine (XYZAL ) 5 MG tablet    Sig: Take 1 tablet (5 mg total) by mouth daily.    Dispense:  90 tablet    Refill:  3   omeprazole  (PRILOSEC) 20 MG capsule    Sig: Take 1 capsule (20 mg total) by mouth daily.    Dispense:  90 capsule    Refill:  3   apixaban  (ELIQUIS ) 5 MG TABS tablet    Sig: Take 1 tablet (5 mg total) by mouth 2 (two) times daily.    Dispense:  180 tablet    Refill:  1   HYDROcodone -acetaminophen  (NORCO) 10-325 MG tablet    Sig: Take 1 tablet by mouth every 8 (eight) hours as needed.    Dispense:  60 tablet    Refill:  0    Call pt when ready   insulin degludec  (TRESIBA  FLEXTOUCH) 200 UNIT/ML FlexTouch Pen    Sig: Inject 50 Units into the skin daily.    Dispense:  9 mL    Refill:  1   lisinopril -hydrochlorothiazide  (ZESTORETIC ) 20-12.5 MG tablet    Sig: Take 1 tablet by mouth daily.    Dispense:  90 tablet    Refill:  3   DISCONTD: pregabalin  (LYRICA ) 150 MG capsule    Sig: Take 1 capsule (150 mg  total) by mouth 2 (two) times daily.    Dispense:  180 capsule    Refill:  1   temazepam  (RESTORIL ) 30 MG capsule    Sig: Take 1 capsule (30 mg total) by mouth at bedtime as needed for sleep.    Dispense:  90 capsule    Refill:  1    Call pt when ready   Evolocumab (REPATHA SURECLICK) 140 MG/ML SOAJ    Sig: Inject 140 mg into the skin every 14 (fourteen) days.    Dispense:  6 mL    Refill:  2   gabapentin  (NEURONTIN ) 600 MG tablet    Sig: Take 1 tablet (600 mg total) by mouth 3 (three) times daily. Two tabs PO TID    Dispense:  270 tablet    Refill:  1    Please fill instead of lyrica .    Return in about 4 months (around 10/28/2023) for Type 2 Diabetes, Hypertension.

## 2023-06-29 LAB — CBC WITH DIFFERENTIAL/PLATELET
Basophils Absolute: 0.1 10*3/uL (ref 0.0–0.2)
Basos: 1 %
EOS (ABSOLUTE): 0.2 10*3/uL (ref 0.0–0.4)
Eos: 2 %
Hematocrit: 47.4 % (ref 37.5–51.0)
Hemoglobin: 14.1 g/dL (ref 13.0–17.7)
Immature Grans (Abs): 0 10*3/uL (ref 0.0–0.1)
Immature Granulocytes: 0 %
Lymphocytes Absolute: 2.5 10*3/uL (ref 0.7–3.1)
Lymphs: 31 %
MCH: 26 pg — ABNORMAL LOW (ref 26.6–33.0)
MCHC: 29.7 g/dL — ABNORMAL LOW (ref 31.5–35.7)
MCV: 87 fL (ref 79–97)
Monocytes Absolute: 0.5 10*3/uL (ref 0.1–0.9)
Monocytes: 6 %
Neutrophils Absolute: 4.8 10*3/uL (ref 1.4–7.0)
Neutrophils: 60 %
Platelets: 202 10*3/uL (ref 150–450)
RBC: 5.43 x10E6/uL (ref 4.14–5.80)
RDW: 14.4 % (ref 11.6–15.4)
WBC: 8.1 10*3/uL (ref 3.4–10.8)

## 2023-06-29 LAB — CMP14+EGFR
ALT: 18 IU/L (ref 0–44)
AST: 16 IU/L (ref 0–40)
Albumin: 4.2 g/dL (ref 3.9–4.9)
Alkaline Phosphatase: 87 IU/L (ref 44–121)
BUN/Creatinine Ratio: 22 (ref 10–24)
BUN: 30 mg/dL — ABNORMAL HIGH (ref 8–27)
Bilirubin Total: 0.5 mg/dL (ref 0.0–1.2)
CO2: 22 mmol/L (ref 20–29)
Calcium: 9.1 mg/dL (ref 8.6–10.2)
Chloride: 98 mmol/L (ref 96–106)
Creatinine, Ser: 1.35 mg/dL — ABNORMAL HIGH (ref 0.76–1.27)
Globulin, Total: 2.6 g/dL (ref 1.5–4.5)
Glucose: 173 mg/dL — ABNORMAL HIGH (ref 70–99)
Potassium: 4.5 mmol/L (ref 3.5–5.2)
Sodium: 137 mmol/L (ref 134–144)
Total Protein: 6.8 g/dL (ref 6.0–8.5)
eGFR: 58 mL/min/{1.73_m2} — ABNORMAL LOW (ref >59)

## 2023-06-29 LAB — URINE DRUG PANEL 7

## 2023-06-29 LAB — LIPID PANEL WITH LDL/HDL RATIO
Cholesterol, Total: 273 mg/dL — ABNORMAL HIGH (ref 100–199)
HDL: 32 mg/dL — ABNORMAL LOW (ref >39)
LDL Chol Calc (NIH): 169 mg/dL — ABNORMAL HIGH (ref 0–99)
LDL/HDL Ratio: 5.3 ratio — ABNORMAL HIGH (ref 0.0–3.6)
Triglycerides: 369 mg/dL — ABNORMAL HIGH (ref 0–149)
VLDL Cholesterol Cal: 72 mg/dL — ABNORMAL HIGH (ref 5–40)

## 2023-06-29 LAB — MICROALBUMIN / CREATININE URINE RATIO
Creatinine, Urine: 93.2 mg/dL
Microalb/Creat Ratio: 22 mg/g{creat} (ref 0–29)
Microalbumin, Urine: 20.4 ug/mL

## 2023-06-29 LAB — VITAMIN B12: Vitamin B-12: 756 pg/mL (ref 232–1245)

## 2023-06-29 LAB — PSA: Prostate Specific Ag, Serum: 1.1 ng/mL (ref 0.0–4.0)

## 2023-06-29 LAB — TSH: TSH: 3.36 u[IU]/mL (ref 0.450–4.500)

## 2023-07-01 LAB — URINE DRUG PANEL 7: Creatinine, Urine: 103.9 mg/dL (ref 20.0–300.0)

## 2023-07-01 LAB — OPIATES CONFIRMATION, URINE
Codeine: NEGATIVE
Hydrocodone Confirm: 949 ng/mL
Hydrocodone: POSITIVE — AB
Hydromorphone: NEGATIVE
Morphine: NEGATIVE
Opiates: POSITIVE ng/mL — AB

## 2023-07-10 ENCOUNTER — Ambulatory Visit: Payer: Self-pay | Admitting: Family Medicine

## 2023-07-29 ENCOUNTER — Other Ambulatory Visit: Payer: Self-pay | Admitting: Family Medicine

## 2023-07-29 MED ORDER — GABAPENTIN 600 MG PO TABS: 600.0000 mg | ORAL_TABLET | Freq: Three times a day (TID) | ORAL | 1 refills | Status: DC

## 2023-07-29 NOTE — Telephone Encounter (Signed)
 Copied from CRM [id number]. Topic: Clinical - Medication Refill >> Jul 29, 2023 10:36 AM Cherylann RAMAN wrote: Medication: gabapentin  (NEURONTIN ) 600 MG tablet  Has the patient contacted their pharmacy? Yes (Agent: If no, request that the patient contact the pharmacy for the refill. If patient does not wish to contact the pharmacy document the reason why and proceed with request.) (Agent: If yes, when and what did the pharmacy advise?) Stated they never received the prescription and will not request a refill   This is the patient's preferred pharmacy:  [facility] #[id number] - [place], [place] - [address] [place] [address] Phone: [phone] Fax: [phone]  Is this the correct pharmacy for this prescription? Yes If no, delete pharmacy and type the correct one.   Has the prescription been filled recently? No  Is the patient out of the medication? Yes  Has the patient been seen for an appointment in the last year OR does the patient have an upcoming appointment? Yes  Can we respond through MyChart? No  Agent: Please be advised that Rx refills may take up to 3 business days. We ask that you follow-up with your pharmacy.

## 2023-08-11 ENCOUNTER — Telehealth: Payer: Self-pay

## 2023-08-11 MED ORDER — GABAPENTIN 600 MG PO TABS: 600.0000 mg | ORAL_TABLET | Freq: Three times a day (TID) | ORAL | 1 refills | Status: DC

## 2023-08-11 NOTE — Telephone Encounter (Signed)
 Copied from CRM [id number]. Topic: Clinical - Prescription Issue >> Aug 11, 2023  3:06 PM Carrielelia G wrote: Reason for CRM: Gabapentin  (NEURONTIN ) 600 MG tablet patient has be told by pharmacy that his medication is not available .SABRASABRAPlease advise   Pharmacy [facility] #[id number] - [place], [place] - [address] [place]  [address] Phone: [phone] Fax: [phone] Hours: Not open 24 hours

## 2023-08-11 NOTE — Telephone Encounter (Signed)
 Forwarding message to [employer] covering Dr. Alvia Sermon with [facility]  States the prescription they received has two different directions as below: Sig: Take 1 tablet (600 mg total) by mouth 3 (three) times daily. Two tabs PO TID  Requesting clarification or be resent with correct directions.

## 2023-09-03 ENCOUNTER — Other Ambulatory Visit: Payer: Self-pay | Admitting: Family Medicine

## 2023-09-08 ENCOUNTER — Encounter: Payer: Self-pay | Admitting: Sports Medicine

## 2023-09-28 ENCOUNTER — Other Ambulatory Visit: Payer: Self-pay | Admitting: Family Medicine

## 2023-10-07 ENCOUNTER — Other Ambulatory Visit: Payer: Self-pay

## 2023-10-07 MED ORDER — BD PEN NEEDLE NANO 2ND GEN 32G X 4 MM MISC: 1 refills | Status: AC

## 2023-10-12 ENCOUNTER — Other Ambulatory Visit: Payer: Self-pay | Admitting: Family Medicine

## 2023-10-12 NOTE — Telephone Encounter (Unsigned)
 Copied from CRM [id number]. Topic: Clinical - Medication Refill >> Oct 12, 2023  9:38 AM Vincent Wall wrote: Medication: HYDROcodone -acetaminophen  (NORCO) 10-325 MG tablet  Has the patient contacted their pharmacy? Yes (Agent: If no, request that the patient contact the pharmacy for the refill. If patient does not wish to contact the pharmacy document the reason why and proceed with request.) (Agent: If yes, when and what did the pharmacy advise?)  This is the patient's preferred pharmacy:  [facility] #[id number] - [place], [place] - [address] [place] [address] Phone: [phone] Fax: [phone]   Is this the correct pharmacy for this prescription? Yes If no, delete pharmacy and type the correct one.   Has the prescription been filled recently? No  Is the patient out of the medication? Yes  Has the patient been seen for an appointment in the last year OR does the patient have an upcoming appointment? Yes  Can we respond through MyChart? No  Agent: Please be advised that Rx refills may take up to 3 business days. We ask that you follow-up with your pharmacy.

## 2023-10-14 MED ORDER — HYDROCODONE-ACETAMINOPHEN 10-325 MG PO TABS: 1.0000 | ORAL_TABLET | Freq: Three times a day (TID) | ORAL | 0 refills | Status: AC | PRN

## 2023-10-28 ENCOUNTER — Ambulatory Visit: Admitting: Family Medicine

## 2023-11-07 ENCOUNTER — Encounter: Payer: Self-pay | Admitting: Family Medicine

## 2023-11-07 ENCOUNTER — Ambulatory Visit: Admitting: Family Medicine

## 2023-11-07 VITALS — BP 116/69 | HR 86 | Ht 68.0 in | Wt 175.0 lb

## 2023-11-07 DIAGNOSIS — I2510 Atherosclerotic heart disease of native coronary artery without angina pectoris: Secondary | ICD-10-CM

## 2023-11-07 DIAGNOSIS — M5416 Radiculopathy, lumbar region: Secondary | ICD-10-CM

## 2023-11-07 DIAGNOSIS — R29818 Other symptoms and signs involving the nervous system: Secondary | ICD-10-CM

## 2023-11-07 DIAGNOSIS — E790 Hyperuricemia without signs of inflammatory arthritis and tophaceous disease: Secondary | ICD-10-CM

## 2023-11-07 DIAGNOSIS — Z794 Long term (current) use of insulin: Secondary | ICD-10-CM | POA: Diagnosis not present

## 2023-11-07 DIAGNOSIS — I10 Essential (primary) hypertension: Secondary | ICD-10-CM

## 2023-11-07 DIAGNOSIS — E1140 Type 2 diabetes mellitus with diabetic neuropathy, unspecified: Secondary | ICD-10-CM

## 2023-11-07 LAB — POCT GLYCOSYLATED HEMOGLOBIN (HGB A1C): HbA1c, POC (controlled diabetic range): 6.5 % (ref 0.0–7.0)

## 2023-11-07 MED ORDER — COLCHICINE 0.6 MG PO TABS: ORAL_TABLET | ORAL | 1 refills | Status: AC

## 2023-11-07 MED ORDER — ONETOUCH ULTRA VI STRP: ORAL_STRIP | 12 refills | Status: DC

## 2023-11-07 MED ORDER — METOPROLOL SUCCINATE ER 50 MG PO TB24: 50.0000 mg | ORAL_TABLET | Freq: Every day | ORAL | 1 refills | Status: AC

## 2023-11-07 MED ORDER — ONETOUCH ULTRA 2 W/DEVICE KIT: PACK | 0 refills | Status: AC

## 2023-11-07 MED ORDER — GABAPENTIN 600 MG PO TABS: 600.0000 mg | ORAL_TABLET | Freq: Three times a day (TID) | ORAL | 1 refills | Status: AC

## 2023-11-07 MED ORDER — TEMAZEPAM 30 MG PO CAPS: 30.0000 mg | ORAL_CAPSULE | Freq: Every evening | ORAL | 1 refills | Status: DC | PRN

## 2023-11-07 MED ORDER — LEVOCETIRIZINE DIHYDROCHLORIDE 5 MG PO TABS: 5.0000 mg | ORAL_TABLET | Freq: Every day | ORAL | 3 refills | Status: AC

## 2023-11-07 MED ORDER — APIXABAN 5 MG PO TABS: 5.0000 mg | ORAL_TABLET | Freq: Two times a day (BID) | ORAL | 1 refills | Status: AC

## 2023-11-07 MED ORDER — ALLOPURINOL 100 MG PO TABS: 100.0000 mg | ORAL_TABLET | Freq: Every day | ORAL | 3 refills | Status: AC

## 2023-11-07 MED ORDER — TRESIBA FLEXTOUCH 200 UNIT/ML ~~LOC~~ SOPN: PEN_INJECTOR | SUBCUTANEOUS | 1 refills | Status: AC

## 2023-11-07 MED ORDER — REPATHA SURECLICK 140 MG/ML ~~LOC~~ SOAJ: 140.0000 mg | SUBCUTANEOUS | 2 refills | Status: AC

## 2023-11-07 MED ORDER — OMEPRAZOLE 20 MG PO CPDR: 20.0000 mg | DELAYED_RELEASE_CAPSULE | Freq: Every day | ORAL | 3 refills | Status: AC

## 2023-11-07 MED ORDER — LISINOPRIL-HYDROCHLOROTHIAZIDE 20-12.5 MG PO TABS: 1.0000 | ORAL_TABLET | Freq: Every day | ORAL | 3 refills | Status: AC

## 2023-11-07 NOTE — Progress Notes (Signed)
 Vincent Wall - 63 y.o. male MRN [id number]  Date of birth: 1960/09/08  Subjective Chief Complaint  Patient presents with   Diabetes    HPI Vincent Wall is a 63 y.o. male here today for follow-up visit.  He reports he is doing okay.  No new concerns at this time.  He has history of hypertension and paroxysmal A-fib.  History of previous MI as well.  He has been followed by cardiology.  He remains on lisinopril /hydrochlorothiazide  as well as metoprolol  for blood pressure management.  He is anticoagulated with Eliquis  and tolerating well.  He has tried to quit smoking but has been unsuccessful.  He denies any chest pain, shortness of breath, palpitations, headaches or vision changes.  Diabetes is well-controlled.  Remains on Tresiba .  He is not monitoring blood sugars at this time but would like a meter sent in.  Doing well with Repatha  for associated hyperlipidemia.  Occasional use of Norco as needed for chronic low back pain.  Stable at this time.  ROS:  A comprehensive ROS was completed and negative except as noted per HPI   Allergies  Allergen Reactions   Jardiance  [Empagliflozin ] Diarrhea   Citalopram Hydrobromide     GI   Crestor [Rosuvastatin]     Myalgia     Lipitor [Atorvastatin]     Myalgias   Metformin  And Related Diarrhea   Nabumetone     GI   Viagra  [Sildenafil Citrate]     GI upset    Past Medical History:  Diagnosis Date   Elevated uric acid in blood 06/16/2018   Heart attack (HCC)    Heart disease    Hyperlipidemia    Hypertension     Past Surgical History:  Procedure Laterality Date   CORONARY STENT PLACEMENT  05/2010   ELBOW SURGERY  2010    Social History   Socioeconomic History   Marital status: Married    Spouse name: Not on file   Number of children: Not on file   Years of education: Not on file   Highest education level: Not on file  Occupational History   Not on file  Tobacco Use   Smoking status: Every Day    Current packs/day:  1.00    Average packs/day: 1 pack/day for 20.0 years (20.0 ttl pk-yrs)    Types: Cigarettes   Smokeless tobacco: Never  Substance and Sexual Activity   Alcohol use: Not Currently    Alcohol/week: 1.0 standard drink of alcohol    Types: 1 Standard drinks or equivalent per week   Drug use: No   Sexual activity: Yes    Partners: Female  Other Topics Concern   Not on file  Social History Narrative   Not on file   Social Drivers of Health   Financial Resource Strain: Low Risk  (09/16/2022)   Received from [facility]   Overall Financial Resource Strain (CARDIA)    Difficulty of Paying Living Expenses: Not hard at all  Food Insecurity: No Food Insecurity (09/16/2022)   Received from [facility]   Hunger Vital Sign    Within the past 12 months, you worried that your food would run out before you got the money to buy more.: Never true    Within the past 12 months, the food you bought just didn't last and you didn't have money to get more.: Never true  Transportation Needs: No Transportation Needs (09/16/2022)   Received from [facility] - Transportation  Lack of Transportation (Medical): No    Lack of Transportation (Non-Medical): No  Physical Activity: Not on file  Stress: Stress Concern Present (09/17/2020)   Received from [facility] of Occupational Health - Occupational Stress Questionnaire    Feeling of Stress : To some extent  Social Connections: Unknown (05/04/2021)   Received from [facility]   Social Network    Social Network: Not on file    Family History  Problem Relation Age of Onset   Breast cancer Unknown    Heart attack Unknown    Diabetes Unknown    Hyperlipidemia Unknown    Hypertension Unknown     Health Maintenance  Topic Date Due   Colonoscopy  Never done   OPHTHALMOLOGY EXAM  09/03/2022   Zoster Vaccines- Shingrix (1 of 2) 02/07/2024 (Originally 01/06/2008)   Influenza Vaccine  04/03/2024 (Originally 08/05/2023)    Lung Cancer Screening  11/06/2024 (Originally 01/06/2008)   Pneumococcal Vaccine: 50+ Years (2 of 2 - PCV) 11/06/2024 (Originally 10/27/2011)   COVID-19 Vaccine (3 - 2025-26 season) 11/22/2024 (Originally 09/05/2023)   FOOT EXAM  03/21/2024   HEMOGLOBIN A1C  05/06/2024   Diabetic kidney evaluation - eGFR measurement  06/27/2024   Diabetic kidney evaluation - Urine ACR  06/27/2024   DTaP/Tdap/Td (3 - Td or Tdap) 04/25/2030   Hepatitis C Screening  Completed   Hepatitis B Vaccines 19-59 Average Risk  Aged Out   Meningococcal B Vaccine  Aged Out   HIV Screening  Discontinued     ----------------------------------------------------------------------------------------------------------------------------------------------------------------------------------------------------------------- Physical Exam BP 116/69   Pulse 86   Ht 5' 8 (1.727 m)   Wt 175 lb (79.4 kg)   SpO2 98%   BMI 26.61 kg/m   Physical Exam Constitutional:      Appearance: Normal appearance.  Eyes:     General: No scleral icterus. Cardiovascular:     Rate and Rhythm: Normal rate and regular rhythm.  Pulmonary:     Effort: Pulmonary effort is normal.     Breath sounds: Normal breath sounds.  Musculoskeletal:     Cervical back: Neck supple.  Neurological:     Mental Status: He is alert.  Psychiatric:        Mood and Affect: Mood normal.        Behavior: Behavior normal.     ------------------------------------------------------------------------------------------------------------------------------------------------------------------------------------------------------------------- Assessment and Plan  Essential hypertension, benign Blood pressure remains well-controlled.  Recommend continuation of current medication.  Type 2 diabetes mellitus with diabetic neuropathy, unspecified (HCC) Blood sugars are better controlled.  Recommend continuation of Tresiba  at current strength.  Meter sent in for home  monitoring.  Continue gabapentin  for associated neuropathy.  CAD (coronary artery disease) He understands risk of additional MI and stroke with current lifestyle..  Encouraged to use Repatha  regularly.  Smoking cessation is strongly advised..   Neurogenic claudication Continues to have lumbar radiculopathy with neurogenic claudications.  He has completed >6 weeks of conservative, physician directed therapy including medications and home exercise program without improvement.  MRI ordered.     Meds ordered this encounter  Medications   allopurinol  (ZYLOPRIM ) 100 MG tablet    Sig: Take 1 tablet (100 mg total) by mouth daily.    Dispense:  90 tablet    Refill:  3   apixaban  (ELIQUIS ) 5 MG TABS tablet    Sig: Take 1 tablet (5 mg total) by mouth 2 (two) times daily.    Dispense:  180 tablet    Refill:  1  colchicine  0.6 MG tablet    Sig: START 2 TABS FOLLOWED BY 1 TAB 1 HOUR LATER. CONTINUE 1 TAB DAILY AS NEEDED FOR TOE PAIN    Dispense:  90 tablet    Refill:  1   Evolocumab  (REPATHA  SURECLICK) 140 MG/ML SOAJ    Sig: Inject 140 mg into the skin every 14 (fourteen) days.    Dispense:  6 mL    Refill:  2   gabapentin  (NEURONTIN ) 600 MG tablet    Sig: Take 1 tablet (600 mg total) by mouth 3 (three) times daily.    Dispense:  270 tablet    Refill:  1    Please fill instead of lyrica .   levocetirizine (XYZAL ) 5 MG tablet    Sig: Take 1 tablet (5 mg total) by mouth daily.    Dispense:  90 tablet    Refill:  3   lisinopril -hydrochlorothiazide  (ZESTORETIC ) 20-12.5 MG tablet    Sig: Take 1 tablet by mouth daily.    Dispense:  90 tablet    Refill:  3   metoprolol  succinate (TOPROL -XL) 50 MG 24 hr tablet    Sig: Take 1 tablet (50 mg total) by mouth daily with breakfast. Take with or immediately following a meal.    Dispense:  90 tablet    Refill:  1   omeprazole  (PRILOSEC) 20 MG capsule    Sig: Take 1 capsule (20 mg total) by mouth daily.    Dispense:  90 capsule    Refill:  3    temazepam  (RESTORIL ) 30 MG capsule    Sig: Take 1 capsule (30 mg total) by mouth at bedtime as needed for sleep.    Dispense:  90 capsule    Refill:  1    Call pt when ready   insulin degludec  (TRESIBA  FLEXTOUCH) 200 UNIT/ML FlexTouch Pen    Sig: ADMINISTER 50 UNITS UNDER THE SKIN DAILY    Dispense:  9 mL    Refill:  1   Blood Glucose Monitoring Suppl (ONE TOUCH ULTRA 2) w/Device KIT    Sig: Use to check glucose daily    Dispense:  1 kit    Refill:  0    May substitute with available insurance preferred alternative.   glucose blood (ONETOUCH ULTRA) test strip    Sig: Use as instructed    Dispense:  100 each    Refill:  12    Return in about 6 months (around 05/06/2024) for Hypertension, Type 2 Diabetes.

## 2023-11-07 NOTE — Assessment & Plan Note (Signed)
 Blood pressure remains well-controlled.  Recommend continuation of current medication.

## 2023-11-07 NOTE — Assessment & Plan Note (Addendum)
 He understands risk of additional MI and stroke with current lifestyle..  Encouraged to use Repatha  regularly.  Smoking cessation is strongly advised.SABRA

## 2023-11-07 NOTE — Assessment & Plan Note (Signed)
 Continues to have lumbar radiculopathy with neurogenic claudications.  He has completed >6 weeks of conservative, physician directed therapy including medications and home exercise program without improvement.  MRI ordered.

## 2023-11-07 NOTE — Assessment & Plan Note (Addendum)
 Blood sugars are better controlled.  Recommend continuation of Tresiba  at current strength.  Meter sent in for home monitoring.  Continue gabapentin  for associated neuropathy.

## 2023-11-11 ENCOUNTER — Telehealth: Payer: Self-pay

## 2023-11-11 ENCOUNTER — Other Ambulatory Visit: Payer: Self-pay

## 2023-11-11 MED ORDER — ONETOUCH ULTRA VI STRP: ORAL_STRIP | 12 refills | Status: AC

## 2023-11-11 NOTE — Telephone Encounter (Signed)
 Copied from CRM [id number]. Topic: Clinical - Prescription Issue >> Nov 11, 2023  9:32 AM Wess RAMAN wrote: Reason for CRM: Pharmacy needs glucose blood (ONETOUCH ULTRA) test strip presciption to say the testing frequency for insurance purposes.  Pharmacy: [facility] #[id number] - [place], [place] - [address] [place] [address] Phone: [phone] Fax: [phone] Hours: Not open 24 hours

## 2024-01-17 ENCOUNTER — Other Ambulatory Visit: Payer: Self-pay

## 2024-01-17 MED ORDER — TEMAZEPAM 30 MG PO CAPS: 30.0000 mg | ORAL_CAPSULE | Freq: Every evening | ORAL | 1 refills | Status: AC | PRN

## 2024-05-11 ENCOUNTER — Ambulatory Visit: Admitting: Family Medicine
